# Patient Record
Sex: Female | Born: 1990 | Race: Black or African American | Hispanic: No | Marital: Single | Smoking: Former smoker
Health system: Southern US, Community
[De-identification: ages and names within clinical notes are randomized; demographics above are authoritative.]

## PROBLEM LIST (undated history)

## (undated) DIAGNOSIS — Z87891 Personal history of nicotine dependence: Secondary | ICD-10-CM

## (undated) DIAGNOSIS — F319 Bipolar disorder, unspecified: Secondary | ICD-10-CM

## (undated) DIAGNOSIS — F99 Mental disorder, not otherwise specified: Secondary | ICD-10-CM

## (undated) HISTORY — PX: NO PAST SURGERIES: SHX2092

---
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
## 2012-08-06 ENCOUNTER — Encounter: Payer: Self-pay | Admitting: General Practice

## 2012-08-06 ENCOUNTER — Ambulatory Visit (INDEPENDENT_AMBULATORY_CARE_PROVIDER_SITE_OTHER): Payer: BC Managed Care – PPO | Admitting: General Practice

## 2012-08-06 VITALS — BP 116/78 | HR 78 | Temp 98.5°F | Ht 64.0 in | Wt 238.0 lb

## 2012-08-06 DIAGNOSIS — H6690 Otitis media, unspecified, unspecified ear: Secondary | ICD-10-CM

## 2012-08-06 DIAGNOSIS — H6692 Otitis media, unspecified, left ear: Secondary | ICD-10-CM

## 2012-08-06 MED ORDER — AMOXICILLIN 500 MG PO CAPS: 500.0000 mg | ORAL_CAPSULE | Freq: Two times a day (BID) | ORAL | Status: DC

## 2012-08-06 MED ORDER — CIPROFLOXACIN-DEXAMETHASONE 0.3-0.1 % OT SUSP: 4.0000 [drp] | Freq: Two times a day (BID) | OTIC | Status: DC

## 2012-08-06 NOTE — Patient Instructions (Addendum)

## 2012-08-06 NOTE — Progress Notes (Signed)
  Subjective:    Patient ID: Anne Marks, female    DOB: 02-Nov-1990, 22 y.o.   MRN: [id number]  Otalgia  There is pain in the left ear. This is a recurrent problem. The current episode started more than 1 month ago. The problem has been gradually worsening. There has been no fever. The pain is at a severity of 9/10. Associated symptoms include a sore throat. Pertinent negatives include no abdominal pain, coughing, diarrhea, drainage, headaches, hearing loss, neck pain, rhinorrhea or vomiting. Treatments tried: decongestant. There is no history of a chronic ear infection.  Reports last menstrual cycle ended three days ago and it was a regular cycle.     Review of Systems  Constitutional: Negative for fever and chills.  HENT: Positive for ear pain and sore throat. Negative for hearing loss, rhinorrhea, sneezing, neck pain, neck stiffness, postnasal drip and sinus pressure.   Respiratory: Negative for cough and chest tightness.   Cardiovascular: Negative for chest pain and palpitations.  Gastrointestinal: Negative for vomiting, abdominal pain and diarrhea.  Genitourinary: Negative for dysuria and difficulty urinating.  Musculoskeletal: Negative for myalgias.  Neurological: Negative for dizziness and headaches.       Objective:   Physical Exam  Constitutional: She is oriented to person, place, and time. She appears well-developed and well-nourished.  HENT:  Head: Normocephalic and atraumatic.  Right Ear: External ear normal.  Left Ear: There is tenderness. No drainage. Tympanic membrane is erythematous.  Mouth/Throat: Oropharynx is clear and moist.  Cardiovascular: Normal rate, regular rhythm and normal heart sounds.   Pulmonary/Chest: Effort normal and breath sounds normal. No respiratory distress. She exhibits no tenderness.  Neurological: She is alert and oriented to person, place, and time.  Skin: Skin is warm and dry.  Psychiatric: She has a normal mood and affect.           Assessment & Plan:  1. Otitis media, left - ciprofloxacin-dexamethasone (CIPRODEX) otic suspension; Place 4 drops into the left ear 2 (two) times daily.  Dispense: 7.5 mL; Refill: 0 - amoxicillin (AMOXIL) 500 MG capsule; Take 1 capsule (500 mg total) by mouth 2 (two) times daily.  Dispense: 30 capsule; Refill: 0 -keep area clean and dry -refrain from placing objects in ear to clean -RTO if symptoms worsen or unresolved -Patient verbalized understanding Coralie Keens, FNP-C

## 2012-11-30 ENCOUNTER — Telehealth: Payer: Self-pay | Admitting: Nurse Practitioner

## 2012-11-30 NOTE — Telephone Encounter (Signed)
beyaz is not generic- loestrin fe is very simiar an dit is generic- woukd like that?

## 2012-12-04 MED ORDER — NORETHIN-ETH ESTRAD-FE BIPHAS 1 MG-10 MCG / 10 MCG PO TABS: 1.0000 | ORAL_TABLET | Freq: Every day | ORAL | Status: DC

## 2012-12-04 NOTE — Telephone Encounter (Signed)
rx changed and sent  To pharmacy

## 2012-12-04 NOTE — Telephone Encounter (Signed)
yes

## 2013-07-15 ENCOUNTER — Encounter: Payer: Self-pay | Admitting: General Practice

## 2013-07-15 ENCOUNTER — Ambulatory Visit (INDEPENDENT_AMBULATORY_CARE_PROVIDER_SITE_OTHER): Payer: BC Managed Care – PPO | Admitting: General Practice

## 2013-07-15 VITALS — BP 124/70 | HR 93 | Temp 98.7°F | Ht 64.0 in | Wt 257.2 lb

## 2013-07-15 DIAGNOSIS — J029 Acute pharyngitis, unspecified: Secondary | ICD-10-CM

## 2013-07-15 LAB — POCT RAPID STREP A (OFFICE): Rapid Strep A Screen: NEGATIVE

## 2013-07-15 MED ORDER — AMOXICILLIN 875 MG PO TABS: 875.0000 mg | ORAL_TABLET | Freq: Two times a day (BID) | ORAL | Status: DC

## 2013-07-15 NOTE — Patient Instructions (Signed)
Sore Throat A sore throat is pain, burning, irritation, or scratchiness of the throat. There is often pain or tenderness when swallowing or talking. A sore throat may be accompanied by other symptoms, such as coughing, sneezing, fever, and swollen neck glands. A sore throat is often the first sign of another sickness, such as a cold, flu, strep throat, or mononucleosis (commonly known as mono). Most sore throats go away without medical treatment. CAUSES  The most common causes of a sore throat include:  A viral infection, such as a cold, flu, or mono.  A bacterial infection, such as strep throat, tonsillitis, or whooping cough.  Seasonal allergies.  Dryness in the air.  Irritants, such as smoke or pollution.  Gastroesophageal reflux disease (GERD). HOME CARE INSTRUCTIONS   Only take over-the-counter medicines as directed by your caregiver.  Drink enough fluids to keep your urine clear or pale yellow.  Rest as needed.  Try using throat sprays, lozenges, or sucking on hard candy to ease any pain (if older than 4 years or as directed).  Sip warm liquids, such as broth, herbal tea, or warm water with honey to relieve pain temporarily. You may also eat or drink cold or frozen liquids such as frozen ice pops.  Gargle with salt water (mix 1 tsp salt with 8 oz of water).  Do not smoke and avoid secondhand smoke.  Put a cool-mist humidifier in your bedroom at night to moisten the air. You can also turn on a hot shower and sit in the bathroom with the door closed for 5 10 minutes. SEEK IMMEDIATE MEDICAL CARE IF:  You have difficulty breathing.  You are unable to swallow fluids, soft foods, or your saliva.  You have increased swelling in the throat.  Your sore throat does not get better in 7 days.  You have nausea and vomiting.  You have a fever or persistent symptoms for more than 2 3 days.  You have a fever and your symptoms suddenly get worse. MAKE SURE YOU:   Understand  these instructions.  Will watch your condition.  Will get help right away if you are not doing well or get worse. Document Released: 03/03/2004 Document Revised: 01/11/2012 Document Reviewed: 10/02/2011 ExitCare Patient Information 2014 [employer].  

## 2013-07-15 NOTE — Progress Notes (Signed)
   Subjective:    Patient ID: Angelia Mould, female    DOB: March 31, 1990, 23 y.o.   MRN: [id number]  Otalgia  There is pain in the right ear. This is a new problem. The current episode started in the past 7 days. The problem occurs every few hours. The problem has been unchanged. There has been no fever. The pain is at a severity of 3/10. Associated symptoms include a sore throat. Pertinent negatives include no coughing, diarrhea, ear discharge, headaches, hearing loss, rash, rhinorrhea or vomiting. She has tried acetaminophen for the symptoms. The treatment provided mild relief.  Sore Throat  This is a new problem. The current episode started in the past 7 days. The problem has been gradually worsening. Neither side of throat is experiencing more pain than the other. There has been no fever. The pain is at a severity of 7/10. Associated symptoms include ear pain. Pertinent negatives include no coughing, diarrhea, drooling, ear discharge, headaches, hoarse voice, shortness of breath or vomiting. She has had no exposure to strep or mono. She has tried cool liquids for the symptoms. The treatment provided mild relief.  Reports last menstrual cycle ended 2 weeks ago and cycle was regular.   Review of Systems  Constitutional: Negative for chills and fatigue.  HENT: Positive for ear pain and sore throat. Negative for drooling, ear discharge, hearing loss, hoarse voice, rhinorrhea, sinus pressure and sneezing.   Respiratory: Negative for cough, chest tightness and shortness of breath.   Cardiovascular: Negative for chest pain and palpitations.  Gastrointestinal: Negative for vomiting and diarrhea.  Skin: Negative for rash.  Neurological: Negative for dizziness, weakness and headaches.       Objective:   Physical Exam  Constitutional: She is oriented to person, place, and time. She appears well-developed and well-nourished.  HENT:  Head: Normocephalic and atraumatic.  Right Ear: External ear normal.    Left Ear: External ear normal.  Nose: Nose normal.  Mouth/Throat: Posterior oropharyngeal erythema present.  2+ bilateral tonsils  Neck: Normal range of motion. Neck supple. No thyromegaly present.  Cardiovascular: Normal rate, regular rhythm and normal heart sounds.   Pulmonary/Chest: Effort normal and breath sounds normal.  Lymphadenopathy:    She has no cervical adenopathy.  Neurological: She is alert and oriented to person, place, and time.  Skin: Skin is warm and dry. No rash noted.  Psychiatric: She has a normal mood and affect.    Results for orders placed in visit on 07/15/13  POCT RAPID STREP A (OFFICE)      Result Value Ref Range   Rapid Strep A Screen Negative  Negative         Assessment & Plan:  1. Sore throat - POCT rapid strep A - Strep A culture, throat - amoxicillin (AMOXIL) 875 MG tablet; Take 1 tablet (875 mg total) by mouth 2 (two) times daily.  Dispense: 20 tablet; Refill: 0 -discussed and provided educational material -RTO if symptoms worsen or unresolved Patient verbalized understanding Erby Pian, FNP-C

## 2013-07-17 LAB — STREP A CULTURE, THROAT

## 2013-08-28 ENCOUNTER — Telehealth: Payer: Self-pay | Admitting: Nurse Practitioner

## 2013-08-28 NOTE — Telephone Encounter (Signed)
Patient aware that Ronnald Collum does not put them in unless you have had a baby

## 2014-02-07 NOTE — L&D Delivery Note (Signed)
  Vaginal Delivery Note The pt utilized an epidural as pain management.   Spontaneous  rupture of membranes today, at 0130 clear.  GBS was negative.   Cervical dilation was complete at  0340.  NICHD Category 2.    Called to RM to apply fetal scalp and evaluate patient.  Lip/100/+2 . Pt rapidly progressed to complete and began to push with guidance at 0348.   After 14 minutes of pushing the head, shoulders and the body of a viable female  infant delivered spontaneously with maternal effort in the ROA position at 0402   With vigorous tone and spontaneous cry, the infant was placed on moms abd.  After the umbilical cord was clamped it was cut by the FOB, then cord blood was obtained for evaluation.  Spontaneous delivery of a intact placenta with a 3 vessel cord via [place] at  [place].   Skyla IUD with strings intact expelled with Placenta.    Episiotomy: None   The vulva, perineum, vaginal vault, rectum and cervix were inspected and revealed a First degree perineal, repaired using a 3-0 vicryl on a CT needle and a   perirethral laceration which was repaired using a 3-0 vicryl on a SH  needle with 10cc of 1% lidocaine. The rectum sphincter intact after the repair.   Patient tolerated repair well.   Postpartum pitocin as ordered.    EBL 151, Pt hemodynamically stable.   Sponge, laps and needle count correct and verified with the primary care nurse.  Attending MD available at all times.    Routine postpartum orders   Mother unsure about method of contraception, considering Mirena.    Baby up for adoption. Undergoing open adoption with couple from [place]    Placenta to pathology: NO     Cord Gases sent to lab: NO Cord blood sent to lab: YES   APGARS:  8 at 1 minute and 9 at 5 minutes Weight:.    Both mom and baby were left in stable condition, baby skin to skin.     Anne Marks, CNM, MSN 01/28/2015. 4:55 AM

## 2014-07-03 ENCOUNTER — Encounter: Payer: Self-pay | Admitting: Physician Assistant

## 2014-07-03 ENCOUNTER — Ambulatory Visit (INDEPENDENT_AMBULATORY_CARE_PROVIDER_SITE_OTHER): Payer: 59 | Admitting: Physician Assistant

## 2014-07-03 VITALS — BP 125/78 | HR 103 | Temp 98.0°F | Ht 64.0 in | Wt 261.2 lb

## 2014-07-03 DIAGNOSIS — L309 Dermatitis, unspecified: Secondary | ICD-10-CM | POA: Diagnosis not present

## 2014-07-03 DIAGNOSIS — B354 Tinea corporis: Secondary | ICD-10-CM | POA: Diagnosis not present

## 2014-07-03 MED ORDER — KETOCONAZOLE 2 % EX SHAM: MEDICATED_SHAMPOO | CUTANEOUS | Status: DC

## 2014-07-03 MED ORDER — HYDROCORTISONE 2.5 % EX CREA: TOPICAL_CREAM | Freq: Two times a day (BID) | CUTANEOUS | Status: DC

## 2014-07-03 MED ORDER — FLUCONAZOLE 150 MG PO TABS: ORAL_TABLET | ORAL | Status: DC

## 2014-07-03 NOTE — Progress Notes (Signed)
   Subjective:    Patient ID: Anne Marks, female    DOB: 05-23-1990, 24 y.o.   MRN: [id number]  HPI 24 y/o female presents with c/o rash on neck x 2 days. Initially was not itching but is now. Has spread to both sides of neck. Has tried otc hydrocortisone with no relief. Washed with peppermint soap. Similar episode 1 year ago.     Review of Systems  Skin: Positive for rash (both sides of neck , pruritic, worsening ).       Objective:   Physical Exam  Constitutional: She appears well-developed and well-nourished.  Skin: Rash noted. No erythema.  Patch plaque lesion on neck with whitish scale Various sizes with marked borders Papules on anterior chest   Nursing note and vitals reviewed.         Assessment & Plan:  1. Dermatitis - shave biopsy of <0.5cm lesion on posterior neck to confirm dx  - Pathology to r/o tinea vs psoriasis - fluconazole (DIFLUCAN) 150 MG tablet; 1 pill q 3 days until finished  Dispense: 4 tablet; Refill: 0 - hydrocortisone 2.5 % cream; Apply topically 2 (two) times daily.  Dispense: 30 g; Refill: 0 - Aerobic culture to r/o bacterial infection   2. Tinea corporis   - ketoconazole (NIZORAL) 2 % shampoo; Wash AA with wash. Leave on 5 min. Rinse x 14 days , then 2-3 times weekly  Dispense: 120 mL; Refill: 2   May use Atarax TID for itch or add zyrtec 10mg  daily  RTO 2 weeks   Cotina Freedman A. Carla Rashad PA-C  \

## 2014-07-03 NOTE — Patient Instructions (Signed)
Continue to use Dove soap Can take Atarax 3 x daily for itch - may add zyrtec 10mg  if needed

## 2014-07-05 LAB — AEROBIC CULTURE

## 2014-07-10 LAB — PATHOLOGY

## 2014-07-18 ENCOUNTER — Ambulatory Visit: Payer: 59 | Admitting: Physician Assistant

## 2014-07-24 ENCOUNTER — Ambulatory Visit (INDEPENDENT_AMBULATORY_CARE_PROVIDER_SITE_OTHER): Payer: 59 | Admitting: Physician Assistant

## 2014-07-24 ENCOUNTER — Encounter: Payer: Self-pay | Admitting: Physician Assistant

## 2014-07-24 VITALS — BP 114/80 | HR 87 | Temp 98.1°F | Ht 64.0 in | Wt 262.0 lb

## 2014-07-24 DIAGNOSIS — L408 Other psoriasis: Secondary | ICD-10-CM | POA: Diagnosis not present

## 2014-07-24 MED ORDER — DERMA-SMOOTHE/FS BODY 0.01 % EX OIL: TOPICAL_OIL | CUTANEOUS | Status: DC

## 2014-07-24 NOTE — Progress Notes (Signed)
   Subjective:    Patient ID: Anne Marks, female    DOB: 06-07-1990, 24 y.o.   MRN: [id number]  HPI 24 y/o female presents for follow up of worsening and pruritic rash on neck. She has completed course of diflucan x 4 doses, continues to use the ketoconazole shampoo and used hydrocortisone briefly. She feels that its much improved, not itching as much. [place]. She used the ketoconazole shampooo which cleared it in two days.     Review of Systems  Skin:       Mild itch and scale remains on neck Dandruff of scalp        Objective:   Physical Exam  Skin:  Plaques on posterior neck, approximately 3cm in diameter x 2 with whitish scale  Significant white scale and flaking on scalp           Assessment & Plan:  1. Seborrheic psoriasis - Alternate t-sal shampoo with ketoconazole shampoo as directed  - Fluocinolone Acetonide (DERMA-SMOOTHE/FS BODY) 0.01 % OIL; Use 2-3 drops on AA daily for itch and scale  Dispense: 1 Bottle; Refill: 3  RTC prn   Krysteena Stalker A. Benjamin Stain PA-C

## 2014-07-24 NOTE — Patient Instructions (Signed)
Use t-sal shampoo once weekly - leave in as directed. Alternate with Ketoconazole shampoo once weekly  Apply oil daily to neck and scalp - 2-3 drops to scalps , 1 drop on neck  Continue to use dove soap     Seborrheic Dermatitis Seborrheic dermatitis involves pink or red skin with greasy, flaky scales. This is often found on the scalp, eyebrows, nose, bearded area, and on or behind the ears. It can also occur on the central chest. It often occurs where there are more oil (sebaceous) glands. This condition is also known as dandruff. When this condition affects a baby's scalp, it is called cradle cap. It may come and go for no known reason. It can occur at any time of life from infancy to old age. CAUSES  The cause is unknown. It is not the result of too little moisture or too much oil. In some people, seborrheic dermatitis flare-ups seem to be triggered by stress. It also commonly occurs in people with certain diseases such as Parkinson's disease or HIV/AIDS. SYMPTOMS   Thick scales on the scalp.  Redness on the face or in the armpits.  The skin may seem oily or dry, but moisturizers do not help.  In infants, seborrheic dermatitis appears as scaly redness that does not seem to bother the baby. In some babies, it affects only the scalp. In others, it also affects the neck creases, armpits, groin, or behind the ears.  In adults and adolescents, seborrheic dermatitis may affect only the scalp. It may look patchy or spread out, with areas of redness and flaking. Other areas commonly affected include:  Eyebrows.  Eyelids.  Forehead.  Skin behind the ears.  Outer ears.  Chest.  Armpits.  Nose creases.  Skin creases under the breasts.  Skin between the buttocks.  Groin.  Some adults and adolescents feel itching or burning in the affected areas. DIAGNOSIS  Your caregiver can usually tell what the problem is by doing a physical exam. TREATMENT   Cortisone (steroid) ointments,  creams, and lotions can help decrease inflammation.  Babies can be treated with baby oil to soften the scales, then they may be washed with baby shampoo. If this does not help, a prescription topical steroid medicine may work.  Adults can use medicated shampoos.  Your caregiver may prescribe corticosteroid cream and shampoo containing an antifungal or yeast medicine (ketoconazole). Hydrocortisone or anti-yeast cream can be rubbed directly onto seborrheic dermatitis patches. Yeast does not cause seborrheic dermatitis, but it seems to add to the problem. In infants, seborrheic dermatitis is often worst during the first year of life. It tends to disappear on its own as the child grows. However, it may return during the teenage years. In adults and adolescents, seborrheic dermatitis tends to be a long-lasting condition that comes and goes over many years. HOME CARE INSTRUCTIONS   Use prescribed medicines as directed.  In infants, do not aggressively remove the scales or flakes on the scalp with a comb or by other means. This may lead to hair loss. SEEK MEDICAL CARE IF:   The problem does not improve from the medicated shampoos, lotions, or other medicines given by your caregiver.  You have any other questions or concerns. Document Released: 01/24/2005 Document Revised: 07/26/2011 Document Reviewed: 06/15/2009 [facility] Patient Information 2015 [place], [place]. This information is not intended to replace advice given to you by your health care provider. Make sure you discuss any questions you have with your health care provider.

## 2014-08-25 ENCOUNTER — Encounter: Payer: Self-pay | Admitting: Family Medicine

## 2014-08-25 ENCOUNTER — Ambulatory Visit (INDEPENDENT_AMBULATORY_CARE_PROVIDER_SITE_OTHER): Payer: 59 | Admitting: Family Medicine

## 2014-08-25 VITALS — Temp 98.5°F | Ht 64.0 in | Wt 258.0 lb

## 2014-08-25 DIAGNOSIS — J029 Acute pharyngitis, unspecified: Secondary | ICD-10-CM | POA: Diagnosis not present

## 2014-08-25 DIAGNOSIS — J039 Acute tonsillitis, unspecified: Secondary | ICD-10-CM

## 2014-08-25 LAB — POCT RAPID STREP A (OFFICE): RAPID STREP A SCREEN: NEGATIVE

## 2014-08-25 MED ORDER — AMOXICILLIN-POT CLAVULANATE 875-125 MG PO TABS: 1.0000 | ORAL_TABLET | Freq: Two times a day (BID) | ORAL | Status: DC

## 2014-08-25 MED ORDER — FLUCONAZOLE 150 MG PO TABS: 150.0000 mg | ORAL_TABLET | Freq: Once | ORAL | Status: DC

## 2014-08-25 NOTE — Progress Notes (Signed)
Subjective:  Patient ID: Anne Marks, female    DOB: 27-Sep-1990  Age: 24 y.o. MRN: [id number]  CC: Sore Throat   HPI Anne Marks presents for   severe sore throat for 2-3 days. Sharp pain in the posterior oropharynx. Decreases her ability to swallow. She has been eating less because of this. Low-grade fever noted.  History Anne Marks has no past medical history on file.   She has no past surgical history on file.   Her family history is not on file.She reports that she quit smoking about 2 years ago. She does not have any smokeless tobacco history on file. She reports that she does not drink alcohol or use illicit drugs.  Outpatient Prescriptions Prior to Visit  Medication Sig Dispense Refill  . hydrOXYzine (ATARAX/VISTARIL) 10 MG tablet   2  . lamoTRIgine (LAMICTAL) 100 MG tablet Take 100 mg by mouth daily.  2  . Fluocinolone Acetonide (DERMA-SMOOTHE/FS BODY) 0.01 % OIL Use 2-3 drops on AA daily for itch and scale 1 Bottle 3  . hydrocortisone 2.5 % cream Apply topically 2 (two) times daily. 30 g 0  . ketoconazole (NIZORAL) 2 % shampoo Wash AA with wash. Leave on 5 min. Rinse x 14 days , then 2-3 times weekly 120 mL 2   No facility-administered medications prior to visit.    ROS Review of Systems  Constitutional: Negative for fever, chills, activity change and appetite change.  HENT: Positive for congestion, postnasal drip and sore throat. Negative for ear discharge, ear pain, hearing loss, nosebleeds, rhinorrhea, sneezing and trouble swallowing.   Respiratory: Negative for chest tightness and shortness of breath.   Cardiovascular: Negative for chest pain and palpitations.  Skin: Negative for rash.    Objective:  Temp(Src) 98.5 F (36.9 C) (Oral)  Ht 5\' 4"  (1.626 m)  Wt 258 lb (117.028 kg)  BMI 44.26 kg/m2  BP Readings from Last 3 Encounters:  07/24/14 114/80  07/03/14 125/78  07/15/13 124/70    Wt Readings from Last 3 Encounters:  08/25/14 258 lb (117.028 kg)    07/24/14 262 lb (118.842 kg)  07/03/14 261 lb 3.2 oz (118.48 kg)     Physical Exam  Constitutional: She appears well-developed and well-nourished.  HENT:  Head: Normocephalic and atraumatic.  Right Ear: Tympanic membrane and external ear normal. No decreased hearing is noted.  Left Ear: Tympanic membrane and external ear normal. No decreased hearing is noted.  Nose: Mucosal edema present. Right sinus exhibits no frontal sinus tenderness. Left sinus exhibits no frontal sinus tenderness.  Mouth/Throat: No uvula swelling. Oropharyngeal exudate and posterior oropharyngeal edema present. No posterior oropharyngeal erythema or tonsillar abscesses (however the tonsils are enlarged and erythematous).  Neck: Normal range of motion. No Brudzinski's sign noted.  Cardiovascular: Normal rate and regular rhythm.   No murmur heard. Pulmonary/Chest: Breath sounds normal. No respiratory distress.  Lymphadenopathy:       Head (right side): No preauricular adenopathy present.       Head (left side): No preauricular adenopathy present.    She has no cervical adenopathy.       Right cervical: No superficial cervical adenopathy present.      Left cervical: No superficial cervical adenopathy present.    No results found for: HGBA1C  No results found for: WBC, HGB, HCT, PLT, GLUCOSE, CHOL, TRIG, HDL, LDLDIRECT, LDLCALC, ALT, AST, NA, K, CL, CREATININE, BUN, CO2, TSH, PSA, INR, GLUF, HGBA1C, MICROALBUR  No results found.  Assessment & Plan:  Anne Marks was seen today for sore throat.  Diagnoses and all orders for this visit:  Sore throat Orders: -     POCT rapid strep A -     Culture, respiratory -     Ambulatory referral to ENT  Acute tonsillitis Orders: -     Culture, respiratory -     Ambulatory referral to ENT  Other orders -     fluconazole (DIFLUCAN) 150 MG tablet; Take 1 tablet (150 mg total) by mouth once. -     amoxicillin-clavulanate (AUGMENTIN) 875-125 MG per tablet; Take 1 tablet by  mouth 2 (two) times daily. Take all of this medication   I have discontinued Ms. Blanchard ketoconazole, hydrocortisone, and DERMA-SMOOTHE/FS BODY. I am also having her start on fluconazole and amoxicillin-clavulanate. Additionally, I am having her maintain her hydrOXYzine and lamoTRIgine.  Meds ordered this encounter  Medications  . fluconazole (DIFLUCAN) 150 MG tablet    Sig: Take 1 tablet (150 mg total) by mouth once.    Dispense:  1 tablet    Refill:  0  . amoxicillin-clavulanate (AUGMENTIN) 875-125 MG per tablet    Sig: Take 1 tablet by mouth 2 (two) times daily. Take all of this medication    Dispense:  20 tablet    Refill:  0     Follow-up: Return if symptoms worsen or fail to improve.  Claretta Fraise, M.D.

## 2014-08-29 LAB — CULTURE, RESPIRATORY W GRAM STAIN

## 2014-08-29 LAB — CULTURE, RESPIRATORY

## 2014-09-01 ENCOUNTER — Telehealth: Payer: Self-pay | Admitting: Family Medicine

## 2014-11-21 ENCOUNTER — Inpatient Hospital Stay (HOSPITAL_COMMUNITY): Payer: 59

## 2014-11-21 ENCOUNTER — Inpatient Hospital Stay (HOSPITAL_COMMUNITY)
Admission: AD | Admit: 2014-11-21 | Discharge: 2014-11-21 | Disposition: A | Discharge status: Home / Self Care | Payer: 59 | Source: Ambulatory Visit | Attending: Obstetrics and Gynecology | Admitting: Obstetrics and Gynecology

## 2014-11-21 ENCOUNTER — Encounter (HOSPITAL_COMMUNITY): Payer: Self-pay

## 2014-11-21 DIAGNOSIS — Z87891 Personal history of nicotine dependence: Secondary | ICD-10-CM | POA: Insufficient documentation

## 2014-11-21 DIAGNOSIS — F319 Bipolar disorder, unspecified: Secondary | ICD-10-CM | POA: Diagnosis not present

## 2014-11-21 DIAGNOSIS — R109 Unspecified abdominal pain: Secondary | ICD-10-CM | POA: Diagnosis present

## 2014-11-21 DIAGNOSIS — O23593 Infection of other part of genital tract in pregnancy, third trimester: Secondary | ICD-10-CM | POA: Diagnosis not present

## 2014-11-21 DIAGNOSIS — Z3A28 28 weeks gestation of pregnancy: Secondary | ICD-10-CM | POA: Insufficient documentation

## 2014-11-21 DIAGNOSIS — B9689 Other specified bacterial agents as the cause of diseases classified elsewhere: Secondary | ICD-10-CM | POA: Diagnosis not present

## 2014-11-21 DIAGNOSIS — O0933 Supervision of pregnancy with insufficient antenatal care, third trimester: Secondary | ICD-10-CM | POA: Diagnosis not present

## 2014-11-21 DIAGNOSIS — Z6841 Body Mass Index (BMI) 40.0 and over, adult: Secondary | ICD-10-CM

## 2014-11-21 DIAGNOSIS — Z975 Presence of (intrauterine) contraceptive device: Secondary | ICD-10-CM | POA: Insufficient documentation

## 2014-11-21 DIAGNOSIS — N760 Acute vaginitis: Secondary | ICD-10-CM | POA: Insufficient documentation

## 2014-11-21 DIAGNOSIS — O99343 Other mental disorders complicating pregnancy, third trimester: Secondary | ICD-10-CM | POA: Insufficient documentation

## 2014-11-21 DIAGNOSIS — O0930 Supervision of pregnancy with insufficient antenatal care, unspecified trimester: Secondary | ICD-10-CM

## 2014-11-21 DIAGNOSIS — O2632 Retained intrauterine contraceptive device in pregnancy, second trimester: Secondary | ICD-10-CM

## 2014-11-21 HISTORY — DX: Bipolar disorder, unspecified: F31.9

## 2014-11-21 HISTORY — DX: Mental disorder, not otherwise specified: F99

## 2014-11-21 LAB — URINALYSIS, ROUTINE W REFLEX MICROSCOPIC
BILIRUBIN URINE: NEGATIVE
GLUCOSE, UA: NEGATIVE mg/dL
Hgb urine dipstick: NEGATIVE
Ketones, ur: >80 mg/dL — AB
Leukocytes, UA: NEGATIVE
Nitrite: POSITIVE — AB
PH: 6.5 (ref 5.0–8.0)
Protein, ur: NEGATIVE mg/dL
Specific Gravity, Urine: 1.02 (ref 1.005–1.030)
Urobilinogen, UA: 0.2 mg/dL (ref 0.0–1.0)

## 2014-11-21 LAB — WET PREP, GENITAL
TRICH WET PREP: NONE SEEN
Yeast Wet Prep HPF POC: NONE SEEN

## 2014-11-21 LAB — URINE MICROSCOPIC-ADD ON

## 2014-11-21 LAB — POCT PREGNANCY, URINE: Preg Test, Ur: POSITIVE — AB

## 2014-11-21 NOTE — MAU Note (Addendum)
hAd been having abd pain and gas pains.  [facility]  Was told to come here for further eval.  9/24 bled for a day.  IUD was placed in April

## 2014-11-21 NOTE — Discharge Instructions (Signed)
Pick up prescription at pharmacy.   Preterm Labor Information Preterm labor is when labor starts at less than 37 weeks of pregnancy. The normal length of a pregnancy is 39 to 41 weeks. CAUSES Often, there is no identifiable underlying cause as to why a woman goes into preterm labor. One of the most common known causes of preterm labor is infection. Infections of the uterus, cervix, vagina, amniotic sac, bladder, kidney, or even the lungs (pneumonia) can cause labor to start. Other suspected causes of preterm labor include:   Urogenital infections, such as yeast infections and bacterial vaginosis.   Uterine abnormalities (uterine shape, uterine septum, fibroids, or bleeding from the placenta).   A cervix that has been operated on (it may fail to stay closed).   Malformations in the fetus.   Multiple gestations (twins, triplets, and so on).   Breakage of the amniotic sac.  RISK FACTORS  Having a previous history of preterm labor.   Having premature rupture of membranes (PROM).   Having a placenta that covers the opening of the cervix (placenta previa).   Having a placenta that separates from the uterus (placental abruption).   Having a cervix that is too weak to hold the fetus in the uterus (incompetent cervix).   Having too much fluid in the amniotic sac (polyhydramnios).   Taking illegal drugs or smoking while pregnant.   Not gaining enough weight while pregnant.   Being younger than 92 and older than 22 years old.   Having a low socioeconomic status.   Being African American. SYMPTOMS Signs and symptoms of preterm labor include:   Menstrual-like cramps, abdominal pain, or back pain.  Uterine contractions that are regular, as frequent as six in an hour, regardless of their intensity (may be mild or painful).  Contractions that start on the top of the uterus and spread down to the lower abdomen and back.   A sense of increased pelvic pressure.   A  watery or bloody mucus discharge that comes from the vagina.  TREATMENT Depending on the length of the pregnancy and other circumstances, your health care provider may suggest bed rest. If necessary, there are medicines that can be given to stop contractions and to mature the fetal lungs. If labor happens before 34 weeks of pregnancy, a prolonged hospital stay may be recommended. Treatment depends on the condition of both you and the fetus.  WHAT SHOULD YOU DO IF YOU THINK YOU ARE IN PRETERM LABOR? Call your health care provider right away. You will need to go to the hospital to get checked immediately. HOW CAN YOU PREVENT PRETERM LABOR IN FUTURE PREGNANCIES? You should:   Stop smoking if you smoke.  Maintain healthy weight gain and avoid chemicals and drugs that are not necessary.  Be watchful for any type of infection.  Inform your health care provider if you have a known history of preterm labor.   This information is not intended to replace advice given to you by your health care provider. Make sure you discuss any questions you have with your health care provider.   Document Released: 04/16/2003 Document Revised: 09/26/2012 Document Reviewed: 02/27/2012 Elsevier Interactive Patient Education 2016 Elsevier Inc. Bacterial Vaginosis Bacterial vaginosis is a vaginal infection that occurs when the normal balance of bacteria in the vagina is disrupted. It results from an overgrowth of certain bacteria. This is the most common vaginal infection in women of childbearing age. Treatment is important to prevent complications, especially in pregnant women, as it  can cause a premature delivery. CAUSES  Bacterial vaginosis is caused by an increase in harmful bacteria that are normally present in smaller amounts in the vagina. Several different kinds of bacteria can cause bacterial vaginosis. However, the reason that the condition develops is not fully understood. RISK FACTORS Certain activities or  behaviors can put you at an increased risk of developing bacterial vaginosis, including:  Having a new sex partner or multiple sex partners.  Douching.  Using an intrauterine device (IUD) for contraception. Women do not get bacterial vaginosis from toilet seats, bedding, swimming pools, or contact with objects around them. SIGNS AND SYMPTOMS  Some women with bacterial vaginosis have no signs or symptoms. Common symptoms include:  Grey vaginal discharge.  A fishlike odor with discharge, especially after sexual intercourse.  Itching or burning of the vagina and vulva.  Burning or pain with urination. DIAGNOSIS  Your health care provider will take a medical history and examine the vagina for signs of bacterial vaginosis. A sample of vaginal fluid may be taken. Your health care provider will look at this sample under a microscope to check for bacteria and abnormal cells. A vaginal pH test may also be done.  TREATMENT  Bacterial vaginosis may be treated with antibiotic medicines. These may be given in the form of a pill or a vaginal cream. A second round of antibiotics may be prescribed if the condition comes back after treatment. Because bacterial vaginosis increases your risk for sexually transmitted diseases, getting treated can help reduce your risk for chlamydia, gonorrhea, HIV, and herpes. HOME CARE INSTRUCTIONS   Only take over-the-counter or prescription medicines as directed by your health care provider.  If antibiotic medicine was prescribed, take it as directed. Make sure you finish it even if you start to feel better.  Tell all sexual partners that you have a vaginal infection. They should see their health care provider and be treated if they have problems, such as a mild rash or itching.  During treatment, it is important that you follow these instructions:  Avoid sexual activity or use condoms correctly.  Do not douche.  Avoid alcohol as directed by your health care  provider.  Avoid breastfeeding as directed by your health care provider. SEEK MEDICAL CARE IF:   Your symptoms are not improving after 3 days of treatment.  You have increased discharge or pain.  You have a fever. MAKE SURE YOU:   Understand these instructions.  Will watch your condition.  Will get help right away if you are not doing well or get worse. FOR MORE INFORMATION  Centers for Disease Control and Prevention, Division of STD Prevention: [employer]raiser[employer]raud[employer] American Sexual Health Association (ASHA): www.ashastd.org    This information is not intended to replace advice given to you by your health care provider. Make sure you discuss any questions you have with your health care provider.   Document Released: 01/24/2005 Document Revised: 02/14/2014 Document Reviewed: 09/05/2012 Elsevier Interactive Patient Education [employer].

## 2014-11-21 NOTE — MAU Note (Signed)
Spoke with Dr. Cletis Media gave report on patient, will be seen by Donnel Saxon CNM after 7 pm

## 2014-11-21 NOTE — Progress Notes (Signed)
Asked patient what day her IUD was placed and she said it was place April 5th 2016

## 2014-11-22 DIAGNOSIS — Z6841 Body Mass Index (BMI) 40.0 and over, adult: Secondary | ICD-10-CM

## 2014-11-22 DIAGNOSIS — F319 Bipolar disorder, unspecified: Secondary | ICD-10-CM | POA: Diagnosis present

## 2014-11-22 DIAGNOSIS — O0930 Supervision of pregnancy with insufficient antenatal care, unspecified trimester: Secondary | ICD-10-CM

## 2014-11-22 NOTE — MAU Provider Note (Signed)
History   24 yo patient presented unannounced c/o abdominal and gas pains, with IUD in place since 05/2014, but +UPT at [facility].  Denies bleeding, leaking, HA, visual sx, or epigastric pain.  [place] in MAU showed SIUP, vtx, 28 6/7 weeks, normal fluid.  Patient very surprised by findings, friend present with her tonight.  Patient advises she does not plan to tell her family of the pregnancy.  Has had generally healthy lifestyle, but has still has some alcohol intake during the last several months.  Patient Active Problem List   Diagnosis Date Noted  . Bipolar 1 disorder ([place]) 11/22/2014  . Late prenatal care--new dx pregnancy at 74 weeks 11/22/2014  . BMI 45.0-49.9, adult [facility]) 11/22/2014    Chief Complaint  Patient presents with  . Abdominal Pain  . Possible Pregnancy   HPI:  See above  OB History    Gravida Para Term Preterm AB TAB SAB Ectopic Multiple Living   1               Past Medical History  Diagnosis Date  . Mental disorder   . Bipolar 1 disorder ([place])     History reviewed. No pertinent past surgical history.  History reviewed. No pertinent family history.  Social History  Substance Use Topics  . Smoking status: Former Smoker    Quit date: 10/09/2011  . Smokeless tobacco: None  . Alcohol Use: Yes     Comment: a weekedn a month   Patient is bisexual  Allergies: No Known Allergies  No prescriptions prior to admission    ROS: Mild lower abdominal pain Physical Exam   Blood pressure 136/72, pulse 80, temperature 98.3 F (36.8 C), temperature source Oral, resp. rate 16, height 5' 3.5" (1.613 m), weight 112.946 kg (249 lb).    Physical Exam  In NAD Chest clear Heart RRR without murmur Abd gravid, FH approx 28-29 cm Pelvic--small amount white d/c in vault, cervix closed, firm.  No IUD string visible at os. Ext WNL  FHR Category 1 on intermittent tracing due to body habitus. No UCs  Results for orders placed or performed during the hospital  encounter of 11/21/14 (from the past 24 hour(s))  Urinalysis, Routine w reflex microscopic (not at [facility])     Status: Abnormal   Collection Time: 11/21/14  4:55 PM  Result Value Ref Range   Color, Urine YELLOW YELLOW   APPearance CLEAR CLEAR   Specific Gravity, Urine 1.020 1.005 - 1.030   pH 6.5 5.0 - 8.0   Glucose, UA NEGATIVE NEGATIVE mg/dL   Hgb urine dipstick NEGATIVE NEGATIVE   Bilirubin Urine NEGATIVE NEGATIVE   Ketones, ur >80 (A) NEGATIVE mg/dL   Protein, ur NEGATIVE NEGATIVE mg/dL   Urobilinogen, UA 0.2 0.0 - 1.0 mg/dL   Nitrite POSITIVE (A) NEGATIVE   Leukocytes, UA NEGATIVE NEGATIVE  Urine microscopic-add on     Status: Abnormal   Collection Time: 11/21/14  4:55 PM  Result Value Ref Range   Squamous Epithelial / LPF FEW (A) RARE   WBC, UA 0-2 <3 WBC/hpf   Bacteria, UA FEW (A) RARE   Urine-Other MUCOUS PRESENT   Pregnancy, urine POC     Status: Abnormal   Collection Time: 11/21/14  5:12 PM  Result Value Ref Range   Preg Test, Ur POSITIVE (A) NEGATIVE  Wet prep, genital     Status: Abnormal   Collection Time: 11/21/14  8:20 PM  Result Value Ref Range   Yeast Wet Prep  HPF POC NONE SEEN NONE SEEN   Trich, Wet Prep NONE SEEN NONE SEEN   Clue Cells Wet Prep HPF POC FEW (A) NONE SEEN   WBC, Wet Prep HPF POC FEW (A) NONE SEEN    ED Course  Assessment: IUP at 28 6/7 weeks New dx of pregnancy with IUD in place Bipolar dx BMI 54 BV  Plan: D/C home per consult with Dr. Mancel Bale Rx Metronidazole 500 mg po BID x 7 days Continue Lamictal and Hydroxyzine. Support to patient for new dx of pregnancy--discussed issue of IUD either has been expelled or is higher up in the uterus.  Will notify office to contact patient on Monday and get her set up ASAP for NOB interview/w-u and [place] for anatomy. GC, chlamydia pending.   Donnel Saxon CNM, MSN 11/22/2014 3:05 AM

## 2014-11-24 LAB — GC/CHLAMYDIA PROBE AMP (~~LOC~~) NOT AT ARMC
Chlamydia: NEGATIVE
Neisseria Gonorrhea: NEGATIVE

## 2014-11-27 LAB — OB RESULTS CONSOLE ABO/RH: RH TYPE: POSITIVE

## 2014-11-27 LAB — OB RESULTS CONSOLE ANTIBODY SCREEN: Antibody Screen: NEGATIVE

## 2014-11-27 LAB — OB RESULTS CONSOLE RUBELLA ANTIBODY, IGM: Rubella: IMMUNE

## 2014-11-27 LAB — OB RESULTS CONSOLE RPR: RPR: NONREACTIVE

## 2014-11-27 LAB — OB RESULTS CONSOLE GC/CHLAMYDIA
Chlamydia: NEGATIVE
Gonorrhea: NEGATIVE

## 2014-11-27 LAB — OB RESULTS CONSOLE HIV ANTIBODY (ROUTINE TESTING): HIV: NONREACTIVE

## 2014-11-27 LAB — OB RESULTS CONSOLE HEPATITIS B SURFACE ANTIGEN: Hepatitis B Surface Ag: NEGATIVE

## 2015-01-02 ENCOUNTER — Encounter (HOSPITAL_COMMUNITY): Payer: Self-pay

## 2015-01-02 ENCOUNTER — Ambulatory Visit (HOSPITAL_COMMUNITY)
Admission: RE | Admit: 2015-01-02 | Discharge: 2015-01-02 | Disposition: A | Discharge status: Home / Self Care | Payer: 59 | Source: Ambulatory Visit | Attending: Obstetrics & Gynecology | Admitting: Obstetrics & Gynecology

## 2015-01-02 ENCOUNTER — Other Ambulatory Visit (HOSPITAL_COMMUNITY): Payer: Self-pay | Admitting: Obstetrics & Gynecology

## 2015-01-02 DIAGNOSIS — Z36 Encounter for antenatal screening of mother: Secondary | ICD-10-CM | POA: Insufficient documentation

## 2015-01-02 DIAGNOSIS — Z3689 Encounter for other specified antenatal screening: Secondary | ICD-10-CM

## 2015-01-02 DIAGNOSIS — Z975 Presence of (intrauterine) contraceptive device: Secondary | ICD-10-CM | POA: Diagnosis not present

## 2015-01-02 DIAGNOSIS — O99213 Obesity complicating pregnancy, third trimester: Secondary | ICD-10-CM | POA: Insufficient documentation

## 2015-01-02 DIAGNOSIS — O283 Abnormal ultrasonic finding on antenatal screening of mother: Secondary | ICD-10-CM

## 2015-01-02 DIAGNOSIS — Z3A35 35 weeks gestation of pregnancy: Secondary | ICD-10-CM

## 2015-01-02 DIAGNOSIS — Z3A34 34 weeks gestation of pregnancy: Secondary | ICD-10-CM | POA: Insufficient documentation

## 2015-01-02 DIAGNOSIS — O0933 Supervision of pregnancy with insufficient antenatal care, third trimester: Secondary | ICD-10-CM | POA: Insufficient documentation

## 2015-01-16 ENCOUNTER — Encounter (HOSPITAL_COMMUNITY): Payer: Self-pay

## 2015-01-19 LAB — OB RESULTS CONSOLE GBS: GBS: NEGATIVE

## 2015-01-23 ENCOUNTER — Inpatient Hospital Stay (HOSPITAL_COMMUNITY)
Admit: 2015-01-23 | Discharge: 2015-01-23 | Disposition: A | Discharge status: Home / Self Care | Payer: 59 | Source: Ambulatory Visit | Attending: Obstetrics & Gynecology | Admitting: Obstetrics & Gynecology

## 2015-01-23 ENCOUNTER — Encounter (HOSPITAL_COMMUNITY): Payer: Self-pay | Admitting: *Deleted

## 2015-01-23 DIAGNOSIS — Z3493 Encounter for supervision of normal pregnancy, unspecified, third trimester: Secondary | ICD-10-CM | POA: Diagnosis present

## 2015-01-23 LAB — URINALYSIS, ROUTINE W REFLEX MICROSCOPIC
Bilirubin Urine: NEGATIVE
GLUCOSE, UA: NEGATIVE mg/dL
Hgb urine dipstick: NEGATIVE
Ketones, ur: NEGATIVE mg/dL
Leukocytes, UA: NEGATIVE
Nitrite: NEGATIVE
Protein, ur: NEGATIVE mg/dL
Specific Gravity, Urine: 1.015 (ref 1.005–1.030)
pH: 6 (ref 5.0–8.0)

## 2015-01-23 NOTE — MAU Note (Signed)
PT  SAYS SHE  STARTED  HURTING  BAD  AT 2350.    VE IN OFFICE-  ON  Monday-  CLOSED.   DENIES HSV AND MRSA.  GBS-NEG

## 2015-01-23 NOTE — Discharge Instructions (Signed)
Braxton Hicks Contractions °Contractions of the uterus can occur throughout pregnancy. Contractions are not always a sign that you are in labor.  °WHAT ARE BRAXTON HICKS CONTRACTIONS?  °Contractions that occur before labor are called Braxton Hicks contractions, or false labor. Toward the end of pregnancy (32-34 weeks), these contractions can develop more often and may become more forceful. This is not true labor because these contractions do not result in opening (dilatation) and thinning of the cervix. They are sometimes difficult to tell apart from true labor because these contractions can be forceful and people have different pain tolerances. You should not feel embarrassed if you go to the hospital with false labor. Sometimes, the only way to tell if you are in true labor is for your health care provider to look for changes in the cervix. °If there are no prenatal problems or other health problems associated with the pregnancy, it is completely safe to be sent home with false labor and await the onset of true labor. °HOW CAN YOU TELL THE DIFFERENCE BETWEEN TRUE AND FALSE LABOR? °False Labor °· The contractions of false labor are usually shorter and not as hard as those of true labor.   °· The contractions are usually irregular.   °· The contractions are often felt in the front of the lower abdomen and in the groin.   °· The contractions may go away when you walk around or change positions while lying down.   °· The contractions get weaker and are shorter lasting as time goes on.   °· The contractions do not usually become progressively stronger, regular, and closer together as with true labor.   °True Labor °· Contractions in true labor last 30-70 seconds, become very regular, usually become more intense, and increase in frequency.   °· The contractions do not go away with walking.   °· The discomfort is usually felt in the top of the uterus and spreads to the lower abdomen and low back.   °· True labor can be  determined by your health care provider with an exam. This will show that the cervix is dilating and getting thinner.   °WHAT TO REMEMBER °· Keep up with your usual exercises and follow other instructions given by your health care provider.   °· Take medicines as directed by your health care provider.   °· Keep your regular prenatal appointments.   °· Eat and drink lightly if you think you are going into labor.   °· If Braxton Hicks contractions are making you uncomfortable:   °¨ Change your position from lying down or resting to walking, or from walking to resting.   °¨ Sit and rest in a tub of warm water.   °¨ Drink 2-3 glasses of water. Dehydration may cause these contractions.   °¨ Do slow and deep breathing several times an hour.   °WHEN SHOULD I SEEK IMMEDIATE MEDICAL CARE? °Seek immediate medical care if: °· Your contractions become stronger, more regular, and closer together.   °· You have fluid leaking or gushing from your vagina.   °· You have a fever.   °· You pass blood-tinged mucus.   °· You have vaginal bleeding.   °· You have continuous abdominal pain.   °· You have low back pain that you never had before.   °· You feel your baby's head pushing down and causing pelvic pressure.   °· Your baby is not moving as much as it used to.   °  °This information is not intended to replace advice given to you by your health care provider. Make sure you discuss any questions you have with your health care   provider. °  °Document Released: 01/24/2005 Document Revised: 01/29/2013 Document Reviewed: 11/05/2012 °Elsevier Interactive Patient Education ©2016 Elsevier Inc. ° °

## 2015-01-25 ENCOUNTER — Encounter (HOSPITAL_COMMUNITY): Payer: Self-pay

## 2015-01-25 ENCOUNTER — Inpatient Hospital Stay (HOSPITAL_COMMUNITY)
Admission: AD | Admit: 2015-01-25 | Discharge: 2015-01-26 | Disposition: A | Discharge status: Home / Self Care | Payer: 59 | Source: Ambulatory Visit | Attending: Obstetrics and Gynecology | Admitting: Obstetrics and Gynecology

## 2015-01-25 DIAGNOSIS — Z6841 Body Mass Index (BMI) 40.0 and over, adult: Secondary | ICD-10-CM

## 2015-01-25 DIAGNOSIS — O479 False labor, unspecified: Secondary | ICD-10-CM

## 2015-01-25 NOTE — MAU Note (Signed)
Pt reports contractions q 5 minutes, denies bleeding or ROM.

## 2015-01-26 ENCOUNTER — Inpatient Hospital Stay (HOSPITAL_COMMUNITY)
Admission: AD | Admit: 2015-01-26 | Discharge: 2015-01-27 | Disposition: A | Discharge status: Home / Self Care | Payer: 59 | Source: Ambulatory Visit | Attending: Obstetrics and Gynecology | Admitting: Obstetrics and Gynecology

## 2015-01-26 ENCOUNTER — Encounter (HOSPITAL_COMMUNITY): Payer: Self-pay | Admitting: *Deleted

## 2015-01-26 DIAGNOSIS — O479 False labor, unspecified: Secondary | ICD-10-CM

## 2015-01-26 DIAGNOSIS — Z6841 Body Mass Index (BMI) 40.0 and over, adult: Secondary | ICD-10-CM

## 2015-01-26 MED ORDER — NALBUPHINE HCL 10 MG/ML IJ SOLN
10.0000 mg | INTRAMUSCULAR | Status: DC | PRN
  Administered 2015-01-26: 10 mg via INTRAMUSCULAR
  Filled 2015-01-26: qty 1

## 2015-01-26 MED ORDER — PROMETHAZINE HCL 25 MG/ML IJ SOLN
25.0000 mg | INTRAMUSCULAR | Status: AC
  Administered 2015-01-26: 25 mg via INTRAMUSCULAR
  Filled 2015-01-26: qty 1

## 2015-01-26 NOTE — Discharge Instructions (Signed)
Braxton Hicks Contractions °Contractions of the uterus can occur throughout pregnancy. Contractions are not always a sign that you are in labor.  °WHAT ARE BRAXTON HICKS CONTRACTIONS?  °Contractions that occur before labor are called Braxton Hicks contractions, or false labor. Toward the end of pregnancy (32-34 weeks), these contractions can develop more often and may become more forceful. This is not true labor because these contractions do not result in opening (dilatation) and thinning of the cervix. They are sometimes difficult to tell apart from true labor because these contractions can be forceful and people have different pain tolerances. You should not feel embarrassed if you go to the hospital with false labor. Sometimes, the only way to tell if you are in true labor is for your health care provider to look for changes in the cervix. °If there are no prenatal problems or other health problems associated with the pregnancy, it is completely safe to be sent home with false labor and await the onset of true labor. °HOW CAN YOU TELL THE DIFFERENCE BETWEEN TRUE AND FALSE LABOR? °False Labor °· The contractions of false labor are usually shorter and not as hard as those of true labor.   °· The contractions are usually irregular.   °· The contractions are often felt in the front of the lower abdomen and in the groin.   °· The contractions may go away when you walk around or change positions while lying down.   °· The contractions get weaker and are shorter lasting as time goes on.   °· The contractions do not usually become progressively stronger, regular, and closer together as with true labor.   °True Labor °· Contractions in true labor last 30-70 seconds, become very regular, usually become more intense, and increase in frequency.   °· The contractions do not go away with walking.   °· The discomfort is usually felt in the top of the uterus and spreads to the lower abdomen and low back.   °· True labor can be  determined by your health care provider with an exam. This will show that the cervix is dilating and getting thinner.   °WHAT TO REMEMBER °· Keep up with your usual exercises and follow other instructions given by your health care provider.   °· Take medicines as directed by your health care provider.   °· Keep your regular prenatal appointments.   °· Eat and drink lightly if you think you are going into labor.   °· If Braxton Hicks contractions are making you uncomfortable:   °¨ Change your position from lying down or resting to walking, or from walking to resting.   °¨ Sit and rest in a tub of warm water.   °¨ Drink 2-3 glasses of water. Dehydration may cause these contractions.   °¨ Do slow and deep breathing several times an hour.   °WHEN SHOULD I SEEK IMMEDIATE MEDICAL CARE? °Seek immediate medical care if: °· Your contractions become stronger, more regular, and closer together.   °· You have fluid leaking or gushing from your vagina.   °· You have a fever.   °· You pass blood-tinged mucus.   °· You have vaginal bleeding.   °· You have continuous abdominal pain.   °· You have low back pain that you never had before.   °· You feel your baby's head pushing down and causing pelvic pressure.   °· Your baby is not moving as much as it used to.   °  °This information is not intended to replace advice given to you by your health care provider. Make sure you discuss any questions you have with your health care   provider. °  °Document Released: 01/24/2005 Document Revised: 01/29/2013 Document Reviewed: 11/05/2012 °Elsevier Interactive Patient Education ©2016 Elsevier Inc. ° °

## 2015-01-26 NOTE — MAU Provider Note (Signed)
History      24 yo, G1P0, 38.1 wks presents to MAU complaining of contractions  That started two days ago but have increased in frequency and intensity in the evening on 01/25/15.  Since presenting to the MAU at 2230 pt was checked and assessed to be 2.5/80/-2 . After 1.5 hrs of monitoring, she was rechecked and found to be 3/80-2.  Pt was given the option to discharge home or walk for 1 hour and be re-checked.   Pt unchanged after 1 hr of walking.  Denies LOf or VB. Reports +FM.    Patient Active Problem List   Diagnosis Date Noted  . Bipolar 1 disorder ([place]) 11/22/2014  . Late prenatal care--new dx pregnancy at 19 weeks 11/22/2014  . BMI 45.0-49.9, adult [facility]) 11/22/2014    Chief Complaint  Patient presents with  . Labor Eval   HPI  OB History    Gravida Para Term Preterm AB TAB SAB Ectopic Multiple Living   1               Past Medical History  Diagnosis Date  . Mental disorder   . Bipolar 1 disorder [facility])     Past Surgical History  Procedure Laterality Date  . No past surgeries      No family history on file.  Social History  Substance Use Topics  . Smoking status: Former Smoker    Quit date: 10/09/2011  . Smokeless tobacco: None  . Alcohol Use: Yes     Comment: a weekedn a month ; not while pregnant    Allergies: No Known Allergies  Prescriptions prior to admission  Medication Sig Dispense Refill Last Dose  . amoxicillin-clavulanate (AUGMENTIN) 875-125 MG per tablet Take 1 tablet by mouth 2 (two) times daily. Take all of this medication (Patient not taking: Reported on 01/02/2015) 20 tablet 0 Unknown at Unknown time  . fluconazole (DIFLUCAN) 150 MG tablet Take 1 tablet (150 mg total) by mouth once. (Patient not taking: Reported on 01/02/2015) 1 tablet 0 Unknown at Unknown time  . hydrOXYzine (ATARAX/VISTARIL) 10 MG tablet   2 More than a month at Unknown time  . lamoTRIgine (LAMICTAL) 100 MG tablet Take 100 mg by mouth daily.  2 More than a month at Unknown  time  . Prenatal Vit-Fe Fumarate-FA (PRENATAL VITAMIN PO) Take by mouth.   01/22/2015 at Unknown time    ROS Physical Exam   Blood pressure 123/70, pulse 99, temperature 97.2 F (36.2 C), temperature source Oral, resp. rate 18, height 5\' 4"  (1.626 m), weight 114.306 kg (252 lb), last menstrual period 05/04/2014, SpO2 99 %.   VE: 3/80/-2 FHT:  Cat 1 tracing, 135 bpm, moderate variability, +accels, no decels UC:  Regular every q4-6 min Membranes: intact  Physical Exam  Constitutional: She is oriented to person, place, and time. She appears well-developed and well-nourished.  HENT:  Head: Normocephalic.  Eyes: EOM are normal. Pupils are equal, round, and reactive to light.  Neck: Normal range of motion.  Cardiovascular: Normal rate and regular rhythm.   Respiratory: Effort normal.  GI: Soft. Bowel sounds are normal.  Genitourinary: Vagina normal.  Musculoskeletal: Normal range of motion.  Neurological: She is alert and oriented to person, place, and time.  Skin: Skin is warm and dry.  Psychiatric: She has a normal mood and affect. Her behavior is normal. Judgment and thought content normal.    ED Course  Assessment: IUP 38.1  Prodromal labor Membranes intact GBS negative Cat 1  tracing  Plan: Discharge home [facility] IM/[facility] IM Attend Routine OB visit scheduled today, 01/26/15 at 10 am Call if symptoms worsen or SROM is suspected   Lavetta Nielsen CNM, MSN 01/26/2015 2:06 AM

## 2015-01-26 NOTE — MAU Note (Signed)
Contractions started on the 17th, but pt thinks they are stronger and closer together. No bleeding or leaking per patient

## 2015-01-27 MED ORDER — PROMETHAZINE HCL 25 MG/ML IJ SOLN
25.0000 mg | INTRAMUSCULAR | Status: AC
  Administered 2015-01-27: 25 mg via INTRAMUSCULAR
  Filled 2015-01-27: qty 1

## 2015-01-27 MED ORDER — NALBUPHINE HCL 10 MG/ML IJ SOLN
10.0000 mg | INTRAMUSCULAR | Status: DC | PRN
  Administered 2015-01-27: 10 mg via INTRAMUSCULAR
  Filled 2015-01-27: qty 1

## 2015-01-27 NOTE — MAU Provider Note (Signed)
  History     24 yo, G1P0, 38.1 wks presents to MAU  Per request of midwife.  Pt complaining of increased pressure and contractions that have increased in intensity and frequency.  Reports contractions every 4-5 min.   Contractions started 3 days ago. Reports she is exhausted and feels contractions all in her back. Was in MAU last night and DC's early this morning.  Previous cervical exam 3/80-2.  Report +FM, and increased discharge but no lof or vb.   Patient Active Problem List   Diagnosis Date Noted  . Braxton Hick's contraction 01/26/2015  . Body mass index (BMI) 40.0-44.9, adult ([place]) 01/26/2015  . Bipolar 1 disorder ([place]) 11/22/2014  . Late prenatal care--new dx pregnancy at 35 weeks 11/22/2014  . BMI 45.0-49.9, adult [facility]) 11/22/2014    Chief Complaint  Patient presents with  . Contractions   HPI  OB History    Gravida Para Term Preterm AB TAB SAB Ectopic Multiple Living   1               Past Medical History  Diagnosis Date  . Mental disorder   . Bipolar 1 disorder [facility])     Past Surgical History  Procedure Laterality Date  . No past surgeries      History reviewed. No pertinent family history.  Social History  Substance Use Topics  . Smoking status: Former Smoker    Quit date: 10/09/2011  . Smokeless tobacco: None  . Alcohol Use: Yes     Comment: a weekedn a month ; not while pregnant    Allergies: No Known Allergies  Prescriptions prior to admission  Medication Sig Dispense Refill Last Dose  . hydrOXYzine (ATARAX/VISTARIL) 10 MG tablet   2 More than a month at Unknown time  . lamoTRIgine (LAMICTAL) 100 MG tablet Take 100 mg by mouth daily.  2 More than a month at Unknown time  . Prenatal Vit-Fe Fumarate-FA (PRENATAL VITAMIN PO) Take by mouth.   01/22/2015 at Unknown time    ROS Physical Exam   Blood pressure 112/77, pulse 98, temperature 98 F (36.7 C), temperature source Oral, resp. rate 18, height 5\' 4"  (1.626 m), weight 113.853 kg (251 lb),  last menstrual period 05/04/2014.    Physical Exam  Constitutional: She is oriented to person, place, and time. She appears well-developed and well-nourished.  HENT:  Head: Normocephalic and atraumatic.  Eyes: EOM are normal. Pupils are equal, round, and reactive to light.  Neck: Normal range of motion.  Cardiovascular: Normal rate and regular rhythm.   Respiratory: Effort normal and breath sounds normal.  GI: Soft. Bowel sounds are normal.  Musculoskeletal: Normal range of motion.  Neurological: She is alert and oriented to person, place, and time.  Skin: Skin is warm.  Psychiatric: She has a normal mood and affect. Her behavior is normal. Judgment and thought content normal.   SVE:  3/90-1  FHT:  Cat 1 tracing, 140 bpm, moderate variability, +accels, no decels UC:  Irregular every 4-5 min, palpate mild    ED Course  Assessment: IUP 38.1  Prodromal labor Membranes intact GBS negative Cat 1 tracing  Plan: Discharge home [facility] IM/[facility]erg[facility] IM Call if symptoms worsen or SROM is suspected   Lavetta Nielsen CNM, MSN 01/27/2015 12:21 AM

## 2015-01-27 NOTE — Discharge Instructions (Signed)
Braxton Hicks Contractions °Contractions of the uterus can occur throughout pregnancy. Contractions are not always a sign that you are in labor.  °WHAT ARE BRAXTON HICKS CONTRACTIONS?  °Contractions that occur before labor are called Braxton Hicks contractions, or false labor. Toward the end of pregnancy (32-34 weeks), these contractions can develop more often and may become more forceful. This is not true labor because these contractions do not result in opening (dilatation) and thinning of the cervix. They are sometimes difficult to tell apart from true labor because these contractions can be forceful and people have different pain tolerances. You should not feel embarrassed if you go to the hospital with false labor. Sometimes, the only way to tell if you are in true labor is for your health care provider to look for changes in the cervix. °If there are no prenatal problems or other health problems associated with the pregnancy, it is completely safe to be sent home with false labor and await the onset of true labor. °HOW CAN YOU TELL THE DIFFERENCE BETWEEN TRUE AND FALSE LABOR? °False Labor °· The contractions of false labor are usually shorter and not as hard as those of true labor.   °· The contractions are usually irregular.   °· The contractions are often felt in the front of the lower abdomen and in the groin.   °· The contractions may go away when you walk around or change positions while lying down.   °· The contractions get weaker and are shorter lasting as time goes on.   °· The contractions do not usually become progressively stronger, regular, and closer together as with true labor.   °True Labor °· Contractions in true labor last 30-70 seconds, become very regular, usually become more intense, and increase in frequency.   °· The contractions do not go away with walking.   °· The discomfort is usually felt in the top of the uterus and spreads to the lower abdomen and low back.   °· True labor can be  determined by your health care provider with an exam. This will show that the cervix is dilating and getting thinner.   °WHAT TO REMEMBER °· Keep up with your usual exercises and follow other instructions given by your health care provider.   °· Take medicines as directed by your health care provider.   °· Keep your regular prenatal appointments.   °· Eat and drink lightly if you think you are going into labor.   °· If Braxton Hicks contractions are making you uncomfortable:   °¨ Change your position from lying down or resting to walking, or from walking to resting.   °¨ Sit and rest in a tub of warm water.   °¨ Drink 2-3 glasses of water. Dehydration may cause these contractions.   °¨ Do slow and deep breathing several times an hour.   °WHEN SHOULD I SEEK IMMEDIATE MEDICAL CARE? °Seek immediate medical care if: °· Your contractions become stronger, more regular, and closer together.   °· You have fluid leaking or gushing from your vagina.   °· You have a fever.   °· You pass blood-tinged mucus.   °· You have vaginal bleeding.   °· You have continuous abdominal pain.   °· You have low back pain that you never had before.   °· You feel your baby's head pushing down and causing pelvic pressure.   °· Your baby is not moving as much as it used to.   °  °This information is not intended to replace advice given to you by your health care provider. Make sure you discuss any questions you have with your health care   provider. °  °Document Released: 01/24/2005 Document Revised: 01/29/2013 Document Reviewed: 11/05/2012 °Elsevier Interactive Patient Education ©2016 Elsevier Inc. ° °

## 2015-01-28 ENCOUNTER — Inpatient Hospital Stay (HOSPITAL_COMMUNITY)
Admission: AD | Admit: 2015-01-28 | Discharge: 2015-01-30 | DRG: 775 | Disposition: A | Discharge status: Home / Self Care | Payer: 59 | Source: Ambulatory Visit | Attending: Obstetrics and Gynecology | Admitting: Obstetrics and Gynecology

## 2015-01-28 ENCOUNTER — Inpatient Hospital Stay (HOSPITAL_COMMUNITY): Payer: 59 | Admitting: Anesthesiology

## 2015-01-28 ENCOUNTER — Encounter (HOSPITAL_COMMUNITY): Payer: Self-pay

## 2015-01-28 DIAGNOSIS — Z3A38 38 weeks gestation of pregnancy: Secondary | ICD-10-CM

## 2015-01-28 DIAGNOSIS — F3181 Bipolar II disorder: Secondary | ICD-10-CM | POA: Diagnosis present

## 2015-01-28 DIAGNOSIS — O99344 Other mental disorders complicating childbirth: Secondary | ICD-10-CM | POA: Diagnosis present

## 2015-01-28 DIAGNOSIS — Z3490 Encounter for supervision of normal pregnancy, unspecified, unspecified trimester: Secondary | ICD-10-CM

## 2015-01-28 DIAGNOSIS — Z87891 Personal history of nicotine dependence: Secondary | ICD-10-CM | POA: Diagnosis not present

## 2015-01-28 DIAGNOSIS — F4329 Adjustment disorder with other symptoms: Secondary | ICD-10-CM | POA: Diagnosis not present

## 2015-01-28 HISTORY — DX: Personal history of nicotine dependence: Z87.891

## 2015-01-28 LAB — TYPE AND SCREEN
ABO/RH(D): A POS
Antibody Screen: NEGATIVE

## 2015-01-28 LAB — CBC
HCT: 33.9 % — ABNORMAL LOW (ref 36.0–46.0)
HCT: 31.7 % — ABNORMAL LOW (ref 36.0–46.0)
Hemoglobin: 11.2 g/dL — ABNORMAL LOW (ref 12.0–15.0)
Hemoglobin: 10.1 g/dL — ABNORMAL LOW (ref 12.0–15.0)
MCH: 26.2 pg (ref 26.0–34.0)
MCH: 25.7 pg — ABNORMAL LOW (ref 26.0–34.0)
MCHC: 33 g/dL (ref 30.0–36.0)
MCHC: 31.9 g/dL (ref 30.0–36.0)
MCV: 79.2 fL (ref 78.0–100.0)
MCV: 80.7 fL (ref 78.0–100.0)
PLATELETS: 406 10*3/uL — AB (ref 150–400)
Platelets: 400 10*3/uL (ref 150–400)
RBC: 4.28 MIL/uL (ref 3.87–5.11)
RBC: 3.93 MIL/uL (ref 3.87–5.11)
RDW: 15.4 % (ref 11.5–15.5)
RDW: 15.7 % — AB (ref 11.5–15.5)
WBC: 24.6 10*3/uL — ABNORMAL HIGH (ref 4.0–10.5)
WBC: 24 10*3/uL — ABNORMAL HIGH (ref 4.0–10.5)

## 2015-01-28 LAB — ABO/RH: ABO/RH(D): A POS

## 2015-01-28 LAB — RPR: RPR: NONREACTIVE

## 2015-01-28 MED ORDER — SIMETHICONE 80 MG PO CHEW: 80.0000 mg | CHEWABLE_TABLET | ORAL | Status: DC | PRN

## 2015-01-28 MED ORDER — ONDANSETRON HCL 4 MG PO TABS: 4.0000 mg | ORAL_TABLET | ORAL | Status: DC | PRN

## 2015-01-28 MED ORDER — ZOLPIDEM TARTRATE 5 MG PO TABS: 5.0000 mg | ORAL_TABLET | Freq: Every evening | ORAL | Status: DC | PRN

## 2015-01-28 MED ORDER — FENTANYL 2.5 MCG/ML BUPIVACAINE 1/10 % EPIDURAL INFUSION (WH - ANES)
14.0000 mL/h | INTRAMUSCULAR | Status: DC | PRN
  Administered 2015-01-28: 14 mL/h via EPIDURAL

## 2015-01-28 MED ORDER — PRENATAL MULTIVITAMIN CH
1.0000 | ORAL_TABLET | Freq: Every day | ORAL | Status: DC
  Administered 2015-01-29 – 2015-01-30 (×2): 1 via ORAL
  Filled 2015-01-28 (×2): qty 1

## 2015-01-28 MED ORDER — LIDOCAINE HCL (PF) 1 % IJ SOLN
INTRAMUSCULAR | Status: DC | PRN
  Administered 2015-01-28 (×2): 4 mL via EPIDURAL

## 2015-01-28 MED ORDER — BENZOCAINE-MENTHOL 20-0.5 % EX AERO: 1.0000 "application " | INHALATION_SPRAY | CUTANEOUS | Status: DC | PRN

## 2015-01-28 MED ORDER — OXYTOCIN BOLUS FROM INFUSION
500.0000 mL | INTRAVENOUS | Status: DC
  Administered 2015-01-28: 500 mL via INTRAVENOUS

## 2015-01-28 MED ORDER — ONDANSETRON HCL 4 MG/2ML IJ SOLN: 4.0000 mg | Freq: Four times a day (QID) | INTRAMUSCULAR | Status: DC | PRN

## 2015-01-28 MED ORDER — EPHEDRINE 5 MG/ML INJ
10.0000 mg | INTRAVENOUS | Status: DC | PRN
  Filled 2015-01-28: qty 2

## 2015-01-28 MED ORDER — SENNOSIDES-DOCUSATE SODIUM 8.6-50 MG PO TABS
2.0000 | ORAL_TABLET | ORAL | Status: DC
  Administered 2015-01-29: 2 via ORAL
  Filled 2015-01-28 (×2): qty 2

## 2015-01-28 MED ORDER — OXYTOCIN 40 UNITS IN LACTATED RINGERS INFUSION - SIMPLE MED
62.5000 mL/h | INTRAVENOUS | Status: DC
  Filled 2015-01-28: qty 1000

## 2015-01-28 MED ORDER — LANOLIN HYDROUS EX OINT: TOPICAL_OINTMENT | CUTANEOUS | Status: DC | PRN

## 2015-01-28 MED ORDER — OXYCODONE-ACETAMINOPHEN 5-325 MG PO TABS: 1.0000 | ORAL_TABLET | ORAL | Status: DC | PRN

## 2015-01-28 MED ORDER — CITRIC ACID-SODIUM CITRATE 334-500 MG/5ML PO SOLN: 30.0000 mL | ORAL | Status: DC | PRN

## 2015-01-28 MED ORDER — PHENYLEPHRINE 40 MCG/ML (10ML) SYRINGE FOR IV PUSH (FOR BLOOD PRESSURE SUPPORT)
80.0000 ug | PREFILLED_SYRINGE | INTRAVENOUS | Status: DC | PRN
  Filled 2015-01-28: qty 2

## 2015-01-28 MED ORDER — LACTATED RINGERS IV SOLN
500.0000 mL | INTRAVENOUS | Status: DC | PRN
  Administered 2015-01-28 (×2): 500 mL via INTRAVENOUS

## 2015-01-28 MED ORDER — IBUPROFEN 600 MG PO TABS
600.0000 mg | ORAL_TABLET | Freq: Four times a day (QID) | ORAL | Status: DC
Start: 2015-01-28 — End: 2015-01-30
  Administered 2015-01-28 – 2015-01-30 (×10): 600 mg via ORAL
  Filled 2015-01-28 (×10): qty 1

## 2015-01-28 MED ORDER — OXYCODONE-ACETAMINOPHEN 5-325 MG PO TABS: 2.0000 | ORAL_TABLET | ORAL | Status: DC | PRN

## 2015-01-28 MED ORDER — ACETAMINOPHEN 325 MG PO TABS: 650.0000 mg | ORAL_TABLET | ORAL | Status: DC | PRN

## 2015-01-28 MED ORDER — PHENYLEPHRINE 40 MCG/ML (10ML) SYRINGE FOR IV PUSH (FOR BLOOD PRESSURE SUPPORT)
PREFILLED_SYRINGE | INTRAVENOUS | Status: AC
  Filled 2015-01-28: qty 20

## 2015-01-28 MED ORDER — WITCH HAZEL-GLYCERIN EX PADS: 1.0000 "application " | MEDICATED_PAD | CUTANEOUS | Status: DC | PRN

## 2015-01-28 MED ORDER — LIDOCAINE HCL (PF) 1 % IJ SOLN
30.0000 mL | INTRAMUSCULAR | Status: AC | PRN
  Administered 2015-01-28: 30 mL via SUBCUTANEOUS
  Filled 2015-01-28: qty 30

## 2015-01-28 MED ORDER — ONDANSETRON HCL 4 MG/2ML IJ SOLN: 4.0000 mg | INTRAMUSCULAR | Status: DC | PRN

## 2015-01-28 MED ORDER — FENTANYL 2.5 MCG/ML BUPIVACAINE 1/10 % EPIDURAL INFUSION (WH - ANES)
INTRAMUSCULAR | Status: AC
  Filled 2015-01-28: qty 125

## 2015-01-28 MED ORDER — TETANUS-DIPHTH-ACELL PERTUSSIS 5-2.5-18.5 LF-MCG/0.5 IM SUSP
0.5000 mL | Freq: Once | INTRAMUSCULAR | Status: DC
Start: 2015-01-29 — End: 2015-01-30

## 2015-01-28 MED ORDER — DIPHENHYDRAMINE HCL 25 MG PO CAPS: 25.0000 mg | ORAL_CAPSULE | Freq: Four times a day (QID) | ORAL | Status: DC | PRN

## 2015-01-28 MED ORDER — LACTATED RINGERS IV SOLN
INTRAVENOUS | Status: DC
  Administered 2015-01-28: 02:00:00 via INTRAVENOUS

## 2015-01-28 MED ORDER — DIPHENHYDRAMINE HCL 50 MG/ML IJ SOLN: 12.5000 mg | INTRAMUSCULAR | Status: DC | PRN

## 2015-01-28 MED ORDER — DIBUCAINE 1 % RE OINT: 1.0000 "application " | TOPICAL_OINTMENT | RECTAL | Status: DC | PRN

## 2015-01-28 MED ORDER — FLEET ENEMA 7-19 GM/118ML RE ENEM: 1.0000 | ENEMA | RECTAL | Status: DC | PRN

## 2015-01-28 NOTE — Anesthesia Preprocedure Evaluation (Signed)
Anesthesia Evaluation  Patient identified by MRN, date of birth, ID band Patient awake    Reviewed: Allergy & Precautions, H&P , Patient's Chart, lab work & pertinent test results  Airway Mallampati: II  TM Distance: >3 FB Neck ROM: full    Dental no notable dental hx. (+) Teeth Intact   Pulmonary neg pulmonary ROS, former smoker,    Pulmonary exam normal breath sounds clear to auscultation       Cardiovascular negative cardio ROS Normal cardiovascular exam Rhythm:regular Rate:Normal     Neuro/Psych PSYCHIATRIC DISORDERS Bipolar Disorder negative neurological ROS     GI/Hepatic negative GI ROS, Neg liver ROS,   Endo/Other  negative endocrine ROS  Renal/GU negative Renal ROS  negative genitourinary   Musculoskeletal negative musculoskeletal ROS (+)   Abdominal   Peds  Hematology negative hematology ROS (+) anemia ,   Anesthesia Other Findings   Reproductive/Obstetrics (+) Pregnancy                             Anesthesia Physical Anesthesia Plan  ASA: III  Anesthesia Plan: Epidural   Post-op Pain Management:    Induction:   Airway Management Planned: Natural Airway  Additional Equipment:   Intra-op Plan:   Post-operative Plan:   Informed Consent: I have reviewed the patients History and Physical, chart, labs and discussed the procedure including the risks, benefits and alternatives for the proposed anesthesia with the patient or authorized representative who has indicated his/her understanding and acceptance.     Plan Discussed with: Anesthesiologist  Anesthesia Plan Comments:         Anesthesia Quick Evaluation

## 2015-01-28 NOTE — Anesthesia Postprocedure Evaluation (Signed)
Anesthesia Post Note  Patient: Anne Marks  Procedure(s) Performed: * No procedures listed *  Patient location during evaluation: L&D Anesthesia Type: Epidural Level of consciousness: awake and alert and oriented Pain management: pain level controlled Vital Signs Assessment: post-procedure vital signs reviewed and stable Respiratory status: spontaneous breathing and respiratory function stable Cardiovascular status: blood pressure returned to baseline Postop Assessment: no headache, no backache, patient able to bend at knees, no signs of nausea or vomiting and adequate PO intake Anesthetic complications: no    Last Vitals:  Filed Vitals:   01/28/15 0901 01/28/15 0931  BP: 122/69 130/75  Pulse: 89 90  Temp:    Resp:      Last Pain:  Filed Vitals:   01/28/15 0931  PainSc: 10-Worst pain ever                 Everrett Lacasse

## 2015-01-28 NOTE — Anesthesia Procedure Notes (Signed)
Epidural Patient location during procedure: OB Start time: 01/28/2015 2:48 AM  Staffing Anesthesiologist: Josephine Igo Performed by: anesthesiologist   Preanesthetic Checklist Completed: patient identified, site marked, surgical consent, pre-op evaluation, timeout performed, IV checked, risks and benefits discussed and monitors and equipment checked  Epidural Patient position: sitting Prep: site prepped and draped and DuraPrep Patient monitoring: continuous pulse ox and blood pressure Approach: midline Location: L3-L4 Injection technique: LOR air  Needle:  Needle type: Tuohy  Needle gauge: 17 G Needle length: 9 cm and 9 Needle insertion depth: 8 cm Catheter type: closed end flexible Catheter size: 19 Gauge Catheter at skin depth: 13 cm Test dose: negative and Other  Assessment Events: blood not aspirated, injection not painful, no injection resistance, negative IV test and no paresthesia  Additional Notes Patient identified. Risks and benefits discussed including failed block, incomplete  Pain control, post dural puncture headache, nerve damage, paralysis, blood pressure Changes, nausea, vomiting, reactions to medications-both toxic and allergic and post Partum back pain. All questions were answered. Patient expressed understanding and wished to proceed. Sterile technique was used throughout procedure. Epidural site was Dressed with sterile barrier dressing. No paresthesias, signs of intravascular injection Or signs of intrathecal spread were encountered.  Patient was more comfortable after the epidural was dosed. Please see RN's note for documentation of vital signs and FHR which are stable.

## 2015-01-28 NOTE — Clinical Social Work Maternal (Signed)
CLINICAL SOCIAL WORK MATERNAL/CHILD NOTE  Patient Details  Name: Anne Marks MRN: [id number] Date of Birth: Jan 27, 1991  Date:  01/28/2015  Clinical Social Worker Initiating Note:  Lucita Ferrara MSW, LCSW Date/ Time Initiated:  01/28/15/1430    Child's Name:  Festus Holts   Legal Guardian:  Alwyn Ren   Need for Interpreter:  None   Date of Referral:  01/28/15     Reason for Referral:  Adoption    Referral Source:  [facility]   Address:  [address] 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  Patient had friend visiting her upon [place] arrival, but friends willingly provided CSW and MOB opportunity to complete assessment in privacy.   Patient presented as easily engaged and receptive to the visit. She presented in a pleasant mood, displayed a full range in affect, and openly processed her thoughts and feelings secondary to intention to place the infant up for the adoption and how she has felt during the childbirth experience.  Patient was observed to be attending to and caring for the infant during the entire visit.   Patient  shared belief that she is doing "better" than she anticipated. She reported that she thought that she would feel sadder, and discussed how she anticipated crying tears of sadness upon the infant's birth since she knows that she will not be discharging home with the infant. Patient reported that she felt happy upon the infant's birth as she feels that the infant is "perfect" and continues to be in awe that she gave birth to an infant.  Patient stated that she wants to room-in with the infant and care for her until they are both medically ready for discharge.   Per patient, she learned that she was pregnant in October, and stated that it was unanticipated and unplanned since she had a permanent form of birth control.  Patient openly discussed range of mixed emotions and thoughts when she learned that she was pregnant, and shared that she was concerned about her readiness to parent since she is currently in college and does not believe that it would be best for her and the infant.  Patient reflected upon the numerous options she considered, and stated that she decided to pursue an open adoption with [place] and the prospective adoptive family is a friend of her friends.  She stated that the adoptive parents are currently in [place] (and have visited), and she feels that she is not giving up the infant, but rather believes that she is providing an infant to a family who is able to provide to her  in ways that she does not believe she can.   Patient shared that she has spoken to other birth mothers, and is anticipating a range of emotions when she is discharged without the infant.  Patient shared that she knows that it will be difficult, and reported that she anticipates that it will be overwhelming.  She expressed belief that she has a strong support system from the father of the infant, her parents, and her friends.   Patient reported that she does not want to begin to complete necessary paperwork or  sign HIPPA consents.  Patient requested that CSW return on 12/22 to continue the process.   Per patient, she noted concerns about her mental health during her freshman year of college. She stated that her roommate recommended that she follow up with the psychology clinic, and patient reported that she was diagnosed with bipolar II in the spring of 2015.  Patient stated that she currently has a therapist and a psychiatrist, and reported that she was previously prescribed Lamictal and Vistaril. Patient reported that she self-discontinued when she learned of the pregnancy.  Patient presented with strong insight and sense of self-awareness as she was able to discuss her symptoms, manic episodes, and depressive episodes.  Patient shared that she noted mixed episodes during the pregnancy without the medications, and shared that she last felt that she had a manic episode 2-3 weeks ago, and last felt depressed on 12/17.  Patient openly discussed how her mental health was closely linked to how she felt about placing the infant up for adoption, and shared that she experienced periods where she did not want to place the infant up for adoption.  Patient stated that when she does not feel depressed or manic, she feels confident in her desire to sign adoption paperwork.   Patient denied any current symptoms of depression or mania, and shared belief that contractions and labor served as a distraction.   Patient expressed intention to follow up with her mental health providers postpartum.  Patient shared that she is aware of her increased risk for ongoing mental health complications based on her mental health history, how she felt during the pregnancy, and the emotional experienced associated with an adoption.  Patient expressed ability to contact her psychiatrist in order to re-start medications as soon as possible.  Patient also reported that she will be able to continue to receive support from her adoption counselor until  the infant is one year old.    Patient denied current questions, concerns, or needs at this time. She denied need for additional support at this time.  CSW reviewed role on MBU, and patient expressed appreciation for presence and support.  Patient inquired about how to best contact CSW, CSW provided contact information.    CSW Plan/Description:   1. Patient/Family Education-- perinatal mood and anxiety disorders  2. Psychosocial Support and Ongoing Assessment of Needs--CSW to follow up with patient on 12/22 in order to continue to provide support and assess level of readiness to initiate and sign adoption papers.    Sheilah Mins, LCSW 01/28/2015, 3:59 PM

## 2015-01-28 NOTE — H&P (Signed)
Anne Marks is a 24 y.o. female, G1P0 at 38.3 weeks, admitted for contractions q 3-4 minutes and SROM at 0153.     Plans an open adoption through an Independent adoption center with couple from [place].  Unexpected dx of pregnancy with Skyla in place 11/21/14 (28 wks) --no IUD string visible at os.  Per [facility] Chart, If IUD not identified at delivery, pt will need Xray before DC from hospital   Patient Active Problem List   Diagnosis Date Noted  . Indication for care in labor or delivery 01/28/2015  . Braxton Hick's contraction 01/26/2015  . Body mass index (BMI) 40.0-44.9, adult ([place]) 01/26/2015  . Bipolar 1 disorder ([place]) 11/22/2014  . Late prenatal care--new dx pregnancy at 96 weeks 11/22/2014  . BMI 45.0-49.9, adult ([place]) 11/22/2014    History of present pregnancy: Patient entered care at 29.4 weeks.   EDC of 02/08/15 was established by LMP and [place] at 28.5wks.    Anatomy scan:  31.1 weeks, with normal findings and an posterior placenta.   Singleton pregnancy. Vertex presentation,  Posterior placental edge is 9.5cm from internal OS- normal. Cervix is closed. AFI normal - 55th%. 4 chamber, RVOT, LVOT,  Aortic Arch, ductal arch, profile, nasal bone, open hands, right arm.  Spine 5th digit, cranial anatomy, placental cord insertion, gender not well visualized due to advanced  Gestational age, maternal body habitus and position.  Additional [place] evaluations:   33.3wks   (12/24/14) OB US F/U: Girl. Single. Vertex. Posterior placenta. Cx appears closed. Fluid appears normal. Unable to visualize aortic arch, ductal arch, L humerus, L radial and ulnar bone d/t fetal position and advanced gest. age. EFW 1808 g, 26%  34.5 wks (01/02/15) MFM [place]:   Seen due to lagging growth, limited views of feta anatomy due to lae gestational age and maternal habitus, EFW 43rd%, AC measures at 19th%. Normal amniotic fluid   Significant prenatal events:    Late to prenatal care. Unexpected dx of pregnancy with Skyla  in place 11/21/14 (28 wks) --no IUD string visible at os.   Stopped taking Lamictal and Hydroxyzine when she found out she was pregnant.    Last evaluation:  01/26/15  [place], [place].    Vertex, 140 bpm,  4/80/-3,  BP100/70     OB History    Gravida Para Term Preterm AB TAB SAB Ectopic Multiple Living   1              Past Medical History  Diagnosis Date  . Mental disorder   . Bipolar 1 disorder [facility])    Past Surgical History  Procedure Laterality Date  . No past surgeries     Family History: family history is not on file. Social History:  reports that she quit smoking about 3 years ago. She does not have any smokeless tobacco history on file. She reports that she drinks alcohol. She reports that she does not use illicit drugs.  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   Plans an open adoption through an Independent adoption center with couple from [place].   Prenatal Transfer Tool  Maternal Diabetes: No Genetic Screening: Declined Maternal Ultrasounds/Referrals: Normal  Fetal Ultrasounds or other Referrals:  Referred to Materal Fetal Medicine  -  Lagging AC Maternal Substance Abuse:  No Significant Maternal Medications:  None Significant Maternal Lab Results: Lab values include: Group B Strep negative  TDAP 12/24/14 Flu  12/24/14   ROS:  Ctx, leaking of fluid,  SROM at 0153, +FM  No Known Allergies   Dilation: 6.5 Effacement (%): 90 Station: 0 Exam by:: B Boyer RN  Blood pressure 155/96, pulse 91, temperature 97.6 F (36.4 C), temperature source Oral, resp. rate 20, last menstrual period 05/04/2014, SpO2 97 %.  Chest clear Heart RRR without murmur Abd gravid, NT, FH 40cm Ext: wnl  FHR: Category 1, 140 bpm, moderate variability, +accels, no decels UCs:  Regular, every 2-4 min  Prenatal labs: ABO, Rh: A/Positive/-- (10/20 0000) Antibody: Negative (10/20 0000) Rubella:  !Error!   Immune RPR: Nonreactive (10/20 0000)  HBsAg:  Negative (10/20 0000)  HIV: Non-reactive (10/20 0000)  GBS: Negative (12/12 0000) Sickle cell/Hgb electrophoresis:  % Pap:  wnl 03/06/14 GC:  Negative 01/19/15 Chlamydia:  Negative 01/19/15 Genetic screenings:   None- late to prenatal care Glucola:  Low (57)  Other:   Hgb 10.1 at NOB 9.9 at 28 weeks    Assessment/Plan: IUP at 38.3 Late to prenatal care Pregnancy with IUD  Pregnancy with adoption planned Body mass index  40.0 Bipolar 1 disorder, not on meds Cat 1 FHT SROM 0153 (01/28/15)    Plan: Admit to [place] per consult with Dr. Cletis Media Routine [facility] orders Pain med/epidural prn If IUD not identified at delivery, pt will need Xray before DC from [place], [place] 01/28/2015, 3:19 AM

## 2015-01-29 NOTE — Progress Notes (Signed)
Psychiatry called about a consult. AVS

## 2015-01-29 NOTE — Discharge Summary (Addendum)
OB Discharge Summary  Patient Name: Anne Marks DOB: 1990/06/17 MRN: [id number]  Date of admission: 01/28/2015 Delivering MD: Lavetta Nielsen   Date of discharge: 01/29/2015  Admitting diagnosis: 39wks, contractions worse Intrauterine pregnancy: [redacted]w[redacted]d     Secondary diagnosis:Principal Problem:   Spontaneous vaginal delivery Active Problems:   Indication for care in labor or delivery   Pregnancy with adoption planned  Additional problems:Hx of bipolor     Discharge diagnosis: Term Pregnancy Delivered                                                                     Post partum procedures:none  Augmentation: none  Complications: None  Hospital course:  Onset of Labor With Vaginal Delivery     24 y.o. yo G1P1001 at [redacted]w[redacted]d was admitted in Active Laboron 01/28/2015. Patient had an uncomplicated labor course as follows:  Membrane Rupture Time/Date: 1:53 AM ,01/28/2015   Intrapartum Procedures: Episiotomy: None [1]                                         Lacerations:  1st degree [2];Perineal [11];Periurethral [8]  Patient had a delivery of a Viable infant. 01/28/2015  Information for the patient's newborn:  Kenidi, Insley [id number]  Delivery Method: Vaginal, Spontaneous Delivery (Filed from Delivery Summary)    Pateint had an uncomplicated postpartum course.  She is ambulating, tolerating a regular diet, passing flatus, and urinating well. Patient is discharged home in stable condition on No discharge date for patient encounter.Marland Kitchen    Physical exam  Filed Vitals:   01/28/15 0931 01/28/15 1420 01/28/15 1813 01/29/15 0603  BP: 130/75 118/71 120/68 114/46  Pulse: 90 95 87 68  Temp:   97.8 F (36.6 C) 97.8 F (36.6 C)  TempSrc:   Oral Oral  Resp:  18 20 18   Height:      Weight:      SpO2:       General: alert and cooperative Lochia: appropriate Uterine Fundus: firm Incision: Healing well with no significant drainage DVT Evaluation: No evidence of DVT seen on  physical exam. Labs: Lab Results  Component Value Date   WBC 24.0* 01/28/2015   HGB 10.1* 01/28/2015   HCT 31.7* 01/28/2015   MCV 80.7 01/28/2015   PLT 400 01/28/2015   No flowsheet data found.  Discharge instruction: per After Visit Summary and "Baby and Me Booklet".  Pt reports she will be see her regular psychiatrist Dr Lenor Coffin to manage her medication   Medications:  Current facility-administered medications:  .  acetaminophen (TYLENOL) tablet 650 mg, 650 mg, Oral, Q4H PRN, Lavetta Nielsen, CNM .  benzocaine-Menthol (DERMOPLAST) 20-0.5 % topical spray 1 application, 1 application, Topical, PRN, Lavetta Nielsen, CNM .  witch hazel-glycerin (TUCKS) pad 1 application, 1 application, Topical, PRN **AND** dibucaine (NUPERCAINAL) 1 % rectal ointment 1 application, 1 application, Rectal, PRN, Lavetta Nielsen, CNM .  diphenhydrAMINE (BENADRYL) capsule 25 mg, 25 mg, Oral, Q6H PRN, Lavetta Nielsen, CNM .  ibuprofen (ADVIL,MOTRIN) tablet 600 mg, 600 mg, Oral, 4 times per day, Lavetta Nielsen, CNM, 600 mg at 01/29/15 0740 .  lanolin  ointment, , Topical, PRN, Lavetta Nielsen, CNM .  lidocaine (PF) (XYLOCAINE) 1 % injection 30 mL, 30 mL, Subcutaneous, PRN, Delsa Bern, MD, 30 mL at 01/28/15 0423 .  ondansetron (ZOFRAN) tablet 4 mg, 4 mg, Oral, Q4H PRN **OR** ondansetron (ZOFRAN) injection 4 mg, 4 mg, Intravenous, Q4H PRN, Lavetta Nielsen, CNM .  oxyCODONE-acetaminophen (PERCOCET/ROXICET) 5-325 MG per tablet 1 tablet, 1 tablet, Oral, Q4H PRN, Lavetta Nielsen, CNM .  oxyCODONE-acetaminophen (PERCOCET/ROXICET) 5-325 MG per tablet 2 tablet, 2 tablet, Oral, Q4H PRN, Lavetta Nielsen, CNM .  prenatal multivitamin tablet 1 tablet, 1 tablet, Oral, Q1200, Lavetta Nielsen, CNM .  senna-docusate (Senokot-S) tablet 2 tablet, 2 tablet, Oral, Q24H, Lavetta Nielsen, CNM, 2 tablet at 01/29/15 0021 .  simethicone (MYLICON) chewable tablet 80 mg, 80 mg, Oral, PRN, Lavetta Nielsen, CNM .  Tdap (BOOSTRIX) injection 0.5 mL, 0.5 mL,  Intramuscular, Once, Lavetta Nielsen, CNM .  zolpidem (AMBIEN) tablet 5 mg, 5 mg, Oral, QHS PRN, Lavetta Nielsen, CNM After Visit Meds:    Medication List    ASK your doctor about these medications        acetaminophen 325 MG tablet  Commonly known as:  TYLENOL  Take 650 mg by mouth every 6 (six) hours as needed for mild pain.     PRENATAL VITAMIN PO  Take by mouth.        Diet: routine diet  Activity: Advance as tolerated. Pelvic rest for 6 weeks.   Outpatient follow up:6 weeks Follow up Appt:No future appointments. Follow up visit: No Follow-up on file.  Postpartum contraception: None  Newborn Data: Live born female  Birth Weight: 5 lb 8.4 oz (2506 g) APGAR: 8, 9  Baby Feeding: None, Baby up for adoption Disposition:Home with adoptive parents  01/30/15 Jacarius Handel, CNM      Postpartum Care After Vaginal Delivery  After you deliver your newborn (postpartum period), the usual stay in the hospital is 24 72 hours. If there were problems with your labor or delivery, or if you have other medical problems, you might be in the hospital longer.  While you are in the hospital, you will receive help and instructions on how to care for yourself and your newborn during the postpartum period.  While you are in the hospital:  Be sure to tell your nurses if you have pain or discomfort, as well as where you feel the pain and what makes the pain worse.  If you had an incision made near your vagina (episiotomy) or if you had some tearing during delivery, the nurses may put ice packs on your episiotomy or tear. The ice packs may help to reduce the pain and swelling.  If you are breastfeeding, you may feel uncomfortable contractions of your uterus for a couple of weeks. This is normal. The contractions help your uterus get back to normal size.  It is normal to have some bleeding after delivery.  For the first 1 3 days after delivery, the flow is red and the amount may be similar  to a period.  It is common for the flow to start and stop.  In the first few days, you may pass some small clots. Let your nurses know if you begin to pass large clots or your flow increases.  Do not  flush blood clots down the toilet before having the nurse look at them.  During the next 3 10 days after delivery, your flow should become more watery and pink or brown-tinged in color.  Ten  to fourteen days after delivery, your flow should be a small amount of yellowish-white discharge.  The amount of your flow will decrease over the first few weeks after delivery. Your flow may stop in 6 8 weeks. Most women have had their flow stop by 12 weeks after delivery.  You should change your sanitary pads frequently.  Wash your hands thoroughly with soap and water for at least 20 seconds after changing pads, using the toilet, or before holding or feeding your newborn.  You should feel like you need to empty your bladder within the first 6 8 hours after delivery.  In case you become weak, lightheaded, or faint, call your nurse before you get out of bed for the first time and before you take a shower for the first time.  Within the first few days after delivery, your breasts may begin to feel tender and full. This is called engorgement. Breast tenderness usually goes away within 48 72 hours after engorgement occurs. You may also notice milk leaking from your breasts. If you are not breastfeeding, do not stimulate your breasts. Breast stimulation can make your breasts produce more milk.  Spending as much time as possible with your newborn is very important. During this time, you and your newborn can feel close and get to know each other. Having your newborn stay in your room (rooming in) will help to strengthen the bond with your newborn. It will give you time to get to know your newborn and become comfortable caring for your newborn.  Your hormones change after delivery. Sometimes the hormone changes  can temporarily cause you to feel sad or tearful. These feelings should not last more than a few days. If these feelings last longer than that, you should talk to your caregiver.  If desired, talk to your caregiver about methods of family planning or contraception.  Talk to your caregiver about immunizations. Your caregiver may want you to have the following immunizations before leaving the hospital:  Tetanus, diphtheria, and pertussis (Tdap) or tetanus and diphtheria (Td) immunization. It is very important that you and your family (including grandparents) or others caring for your newborn are up-to-date with the Tdap or Td immunizations. The Tdap or Td immunization can help protect your newborn from getting ill.  Rubella immunization.  Varicella (chickenpox) immunization.  Influenza immunization. You should receive this annual immunization if you did not receive the immunization during your pregnancy. Document Released: 11/21/2006 Document Revised: 10/19/2011 Document Reviewed: 09/21/2011 [facility] Patient Information 2014 [place].   Postpartum Depression and Baby Blues  The postpartum period begins right after the birth of a baby. During this time, there is often a great amount of joy and excitement. It is also a time of considerable changes in the life of the parent(s). Regardless of how many times a mother gives birth, each child brings new challenges and dynamics to the family. It is not unusual to have feelings of excitement accompanied by confusing shifts in moods, emotions, and thoughts. All mothers are at risk of developing postpartum depression or the "baby blues." These mood changes can occur right after giving birth, or they may occur many months after giving birth. The baby blues or postpartum depression can be mild or severe. Additionally, postpartum depression can resolve rather quickly, or it can be a long-term condition. CAUSES Elevated hormones and their rapid decline  are thought to be a main cause of postpartum depression and the baby blues. There are a number of hormones that radically  change during and after pregnancy. Estrogen and progesterone usually decrease immediately after delivering your baby. The level of thyroid hormone and various cortisol steroids also rapidly drop. Other factors that play a major role in these changes include major life events and genetics.  RISK FACTORS If you have any of the following risks for the baby blues or postpartum depression, know what symptoms to watch out for during the postpartum period. Risk factors that may increase the likelihood of getting the baby blues or postpartum depression include: 1. Havinga personal or family history of depression. 2. Having depression while being pregnant. 3. Having premenstrual or oral contraceptive-associated mood issues. 4. Having exceptional life stress. 5. Having marital conflict. 6. Lacking a social support network. 7. Having a baby with special needs. 8. Having health problems such as diabetes. SYMPTOMS Baby blues symptoms include:  Brief fluctuations in mood, such as going from extreme happiness to sadness.  Decreased concentration.  Difficulty sleeping.  Crying spells, tearfulness.  Irritability.  Anxiety. Postpartum depression symptoms typically begin within the first month after giving birth. These symptoms include:  Difficulty sleeping or excessive sleepiness.  Marked weight loss.  Agitation.  Feelings of worthlessness.  Lack of interest in activity or food. Postpartum psychosis is a very concerning condition and can be dangerous. Fortunately, it is rare. Displaying any of the following symptoms is cause for immediate medical attention. Postpartum psychosis symptoms include:  Hallucinations and delusions.  Bizarre or disorganized behavior.  Confusion or disorientation. DIAGNOSIS  A diagnosis is made by an evaluation of your symptoms. There are no  medical or lab tests that lead to a diagnosis, but there are various questionnaires that a caregiver may use to identify those with the baby blues, postpartum depression, or psychosis. Often times, a screening tool called the [place] Postnatal Depression Scale is used to diagnose depression in the postpartum period.  TREATMENT The baby blues usually goes away on its own in 1 to 2 weeks. Social support is often all that is needed. You should be encouraged to get adequate sleep and rest. Occasionally, you may be given medicines to help you sleep.  Postpartum depression requires treatment as it can last several months or longer if it is not treated. Treatment may include individual or group therapy, medicine, or both to address any social, physiological, and psychological factors that may play a role in the depression. Regular exercise, a healthy diet, rest, and social support may also be strongly recommended.  Postpartum psychosis is more serious and needs treatment right away. Hospitalization is often needed. HOME CARE INSTRUCTIONS  Get as much rest as you can. Nap when the baby sleeps.  Exercise regularly. Some women find yoga and walking to be beneficial.  Eat a balanced and nourishing diet.  Do little things that you enjoy. Have a cup of tea, take a bubble bath, read your favorite magazine, or listen to your favorite music.  Avoid alcohol.  Ask for help with household chores, cooking, grocery shopping, or running errands as needed. Do not try to do everything.  Talk to people close to you about how you are feeling. Get support from your partner, family members, friends, or other new moms.  Try to stay positive in how you think. Think about the things you are grateful for.  Do not spend a lot of time alone.  Only take medicine as directed by your caregiver.  Keep all your postpartum appointments.  Let your caregiver know if you have any concerns. [place]  IF: You are having  a reaction or problems with your medicine. SEEK IMMEDIATE MEDICAL CARE IF:  You have suicidal feelings.  You feel you may harm the baby or someone else. Document Released: 10/29/2003 Document Revised: 04/18/2011 Document Reviewed: 11/30/2010 [facility] Patient Information 2014 [place], [place].     Breastfeeding Deciding to breastfeed is one of the best choices you can make for you and your baby. A change in hormones during pregnancy causes your breast tissue to grow and increases the number and size of your milk ducts. These hormones also allow proteins, sugars, and fats from your blood supply to make breast milk in your milk-producing glands. Hormones prevent breast milk from being released before your baby is born as well as prompt milk flow after birth. Once breastfeeding has begun, thoughts of your baby, as well as his or her sucking or crying, can stimulate the release of milk from your milk-producing glands.  BENEFITS OF BREASTFEEDING For Your Baby  Your first milk (colostrum) helps your baby's digestive system function better.   There are antibodies in your milk that help your baby fight off infections.   Your baby has a lower incidence of asthma, allergies, and sudden infant death syndrome.   The nutrients in breast milk are better for your baby than infant formulas and are designed uniquely for your baby's needs.   Breast milk improves your baby's brain development.   Your baby is less likely to develop other conditions, such as childhood obesity, asthma, or type 2 diabetes mellitus.  For You   Breastfeeding helps to create a very special bond between you and your baby.   Breastfeeding is convenient. Breast milk is always available at the correct temperature and costs nothing.   Breastfeeding helps to burn calories and helps you lose the weight gained during pregnancy.   Breastfeeding makes your uterus contract to its prepregnancy size faster and slows bleeding  (lochia) after you give birth.   Breastfeeding helps to lower your risk of developing type 2 diabetes mellitus, osteoporosis, and breast or ovarian cancer later in life. SIGNS THAT YOUR BABY IS HUNGRY Early Signs of Hunger  Increased alertness or activity.  Stretching.  Movement of the head from side to side.  Movement of the head and opening of the mouth when the corner of the mouth or cheek is stroked (rooting).  Increased sucking sounds, smacking lips, cooing, sighing, or squeaking.  Hand-to-mouth movements.  Increased sucking of fingers or hands. Late Signs of Hunger  Fussing.  Intermittent crying. Extreme Signs of Hunger Signs of extreme hunger will require calming and consoling before your baby will be able to breastfeed successfully. Do not wait for the following signs of extreme hunger to occur before you initiate breastfeeding:   Restlessness.  A loud, strong cry.   Screaming.   BREASTFEEDING BASICS Breastfeeding Initiation  Find a comfortable place to sit or lie down, with your neck and back well supported.  Place a pillow or rolled up blanket under your baby to bring him or her to the level of your breast (if you are seated). Nursing pillows are specially designed to help support your arms and your baby while you breastfeed.  Make sure that your baby's abdomen is facing your abdomen.   Gently massage your breast. With your fingertips, massage from your chest wall toward your nipple in a circular motion. This encourages milk flow. You may need to continue this action during the feeding if your milk flows slowly.  Support  your breast with 4 fingers underneath and your thumb above your nipple. Make sure your fingers are well away from your nipple and your baby's mouth.   Stroke your baby's lips gently with your finger or nipple.   When your baby's mouth is open wide enough, quickly bring your baby to your breast, placing your entire nipple and as much of  the colored area around your nipple (areola) as possible into your baby's mouth.   More areola should be visible above your baby's upper lip than below the lower lip.   Your baby's tongue should be between his or her lower gum and your breast.   Ensure that your baby's mouth is correctly positioned around your nipple (latched). Your baby's lips should create a seal on your breast and be turned out (everted).  It is common for your baby to suck about 2-3 minutes in order to start the flow of breast milk. Latching Teaching your baby how to latch on to your breast properly is very important. An improper latch can cause nipple pain and decreased milk supply for you and poor weight gain in your baby. Also, if your baby is not latched onto your nipple properly, he or she may swallow some air during feeding. This can make your baby fussy. Burping your baby when you switch breasts during the feeding can help to get rid of the air. However, teaching your baby to latch on properly is still the best way to prevent fussiness from swallowing air while breastfeeding. Signs that your baby has successfully latched on to your nipple:    Silent tugging or silent sucking, without causing you pain.   Swallowing heard between every 3-4 sucks.    Muscle movement above and in front of his or her ears while sucking.  Signs that your baby has not successfully latched on to nipple:   Sucking sounds or smacking sounds from your baby while breastfeeding.  Nipple pain. If you think your baby has not latched on correctly, slip your finger into the corner of your baby's mouth to break the suction and place it between your baby's gums. Attempt breastfeeding initiation again. Signs of Successful Breastfeeding Signs from your baby:   A gradual decrease in the number of sucks or complete cessation of sucking.   Falling asleep.   Relaxation of his or her body.   Retention of a small amount of milk in his or  her mouth.   Letting go of your breast by himself or herself. Signs from you:  Breasts that have increased in firmness, weight, and size 1-3 hours after feeding.   Breasts that are softer immediately after breastfeeding.  Increased milk volume, as well as a change in milk consistency and color by the fifth day of breastfeeding.   Nipples that are not sore, cracked, or bleeding. Signs That Your Randel Books is Getting Enough Milk  Wetting at least 3 diapers in a 24-hour period. The urine should be clear and pale yellow by age 22 days.  At least 3 stools in a 24-hour period by age 22 days. The stool should be soft and yellow.  At least 3 stools in a 24-hour period by age 73 days. The stool should be seedy and yellow.  No loss of weight greater than 10% of birth weight during the first 93 days of age.  Average weight gain of 4-7 ounces (113-198 g) per week after age 3 days.  Consistent daily weight gain by age 2 days, without weight  loss after the age of 2 weeks. After a feeding, your baby may spit up a small amount. This is common. BREASTFEEDING FREQUENCY AND DURATION Frequent feeding will help you make more milk and can prevent sore nipples and breast engorgement. Breastfeed when you feel the need to reduce the fullness of your breasts or when your baby shows signs of hunger. This is called "breastfeeding on demand." Avoid introducing a pacifier to your baby while you are working to establish breastfeeding (the first 4-6 weeks after your baby is born). After this time you may choose to use a pacifier. Research has shown that pacifier use during the first year of a baby's life decreases the risk of sudden infant death syndrome (SIDS). Allow your baby to feed on each breast as long as he or she wants. Breastfeed until your baby is finished feeding. When your baby unlatches or falls asleep while feeding from the first breast, offer the second breast. Because newborns are often sleepy in the first few  weeks of life, you may need to awaken your baby to get him or her to feed. Breastfeeding times will vary from baby to baby. However, the following rules can serve as a guide to help you ensure that your baby is properly fed:  Newborns (babies 14 weeks of age or younger) may breastfeed every 1-3 hours.  Newborns should not go longer than 3 hours during the day or 5 hours during the night without breastfeeding.  You should breastfeed your baby a minimum of 8 times in a 24-hour period until you begin to introduce solid foods to your baby at around 89 months of age. BREAST MILK PUMPING Pumping and storing breast milk allows you to ensure that your baby is exclusively fed your breast milk, even at times when you are unable to breastfeed. This is especially important if you are going back to work while you are still breastfeeding or when you are not able to be present during feedings. Your lactation consultant can give you guidelines on how long it is safe to store breast milk.  A breast pump is a machine that allows you to pump milk from your breast into a sterile bottle. The pumped breast milk can then be stored in a refrigerator or freezer. Some breast pumps are operated by hand, while others use electricity. Ask your lactation consultant which type will work best for you. Breast pumps can be purchased, but some hospitals and breastfeeding support groups lease breast pumps on a monthly basis. A lactation consultant can teach you how to hand express breast milk, if you prefer not to use a pump.  CARING FOR YOUR BREASTS WHILE YOU BREASTFEED Nipples can become dry, cracked, and sore while breastfeeding. The following recommendations can help keep your breasts moisturized and healthy:  Avoid using soap on your nipples.   Wear a supportive bra. Although not required, special nursing bras and tank tops are designed to allow access to your breasts for breastfeeding without taking off your entire bra or top.  Avoid wearing underwire-style bras or extremely tight bras.  Air dry your nipples for 3-31minutes after each feeding.   Use only cotton bra pads to absorb leaked breast milk. Leaking of breast milk between feedings is normal.   Use lanolin on your nipples after breastfeeding. Lanolin helps to maintain your skin's normal moisture barrier. If you use pure lanolin, you do not need to wash it off before feeding your baby again. Pure lanolin is not toxic to your  baby. Dennis Bast may also hand express a few drops of breast milk and gently massage that milk into your nipples and allow the milk to air dry. In the first few weeks after giving birth, some women experience extremely full breasts (engorgement). Engorgement can make your breasts feel heavy, warm, and tender to the touch. Engorgement peaks within 3-5 days after you give birth. The following recommendations can help ease engorgement:  Completely empty your breasts while breastfeeding or pumping. You may want to start by applying warm, moist heat (in the shower or with warm water-soaked hand towels) just before feeding or pumping. This increases circulation and helps the milk flow. If your baby does not completely empty your breasts while breastfeeding, pump any extra milk after he or she is finished.  Wear a snug bra (nursing or regular) or tank top for 1-2 days to signal your body to slightly decrease milk production.  Apply ice packs to your breasts, unless this is too uncomfortable for you.  Make sure that your baby is latched on and positioned properly while breastfeeding. If engorgement persists after 48 hours of following these recommendations, contact your health care provider or a Science writer. OVERALL HEALTH CARE RECOMMENDATIONS WHILE BREASTFEEDING  Eat healthy foods. Alternate between meals and snacks, eating 3 of each per day. Because what you eat affects your breast milk, some of the foods may make your baby more irritable than  usual. Avoid eating these foods if you are sure that they are negatively affecting your baby.  Drink milk, fruit juice, and water to satisfy your thirst (about 10 glasses a day).   Rest often, relax, and continue to take your prenatal vitamins to prevent fatigue, stress, and anemia.  Continue breast self-awareness checks.  Avoid chewing and smoking tobacco.  Avoid alcohol and drug use. Some medicines that may be harmful to your baby can pass through breast milk. It is important to ask your health care provider before taking any medicine, including all over-the-counter and prescription medicine as well as vitamin and herbal supplements. It is possible to become pregnant while breastfeeding. If birth control is desired, ask your health care provider about options that will be safe for your baby. SEEK MEDICAL CARE IF:   You feel like you want to stop breastfeeding or have become frustrated with breastfeeding.  You have painful breasts or nipples.  Your nipples are cracked or bleeding.  Your breasts are red, tender, or warm.  You have a swollen area on either breast.  You have a fever or chills.  You have nausea or vomiting.  You have drainage other than breast milk from your nipples.  Your breasts do not become full before feedings by the fifth day after you give birth.  You feel sad and depressed.  Your baby is too sleepy to eat well.  Your baby is having trouble sleeping.   Your baby is wetting less than 3 diapers in a 24-hour period.  Your baby has less than 3 stools in a 24-hour period.  Your baby's skin or the white part of his or her eyes becomes yellow.   Your baby is not gaining weight by 66 days of age. SEEK IMMEDIATE MEDICAL CARE IF:   Your baby is overly tired (lethargic) and does not want to wake up and feed.  Your baby develops an unexplained fever. Document Released: 01/24/2005 Document Revised: 01/29/2013 Document Reviewed: 07/18/2012 [facility]  Patient Information 2015 [place], [place]. This information is not intended to replace advice given  to you by your health care provider. Make sure you discuss any questions you have with your health care provider.  

## 2015-01-29 NOTE — Progress Notes (Signed)
Anne Marks   Subjective: Post Partum Day 1 Vaginal delivery, 1st degree laceration and a periurethral  Patient up ad lib, denies syncope or dizziness. Reports consuming regular diet without issues and denies N/V No issues with urination and reports bleeding is appropriate  Feeding:  bottle Contraceptive plan:   none  Objective: Temp:  [97.8 F (36.6 C)] 97.8 F (36.6 C) (12/22 0603) Pulse Rate:  [68-95] 68 (12/22 0603) Resp:  [18-20] 18 (12/22 0603) BP: (114-120)/(46-71) 114/46 mmHg (12/22 0603)  Physical Exam:  General: alert and cooperative Ext: WNL, no significant  edema. No evidence of DVT seen on physical exam. Breast: Soft filling Lungs: CTAB Heart RRR without murmur  Abdomen:  Soft, fundus firm, lochia scant, + bowel sounds, non distended, non tender Lochia: appropriate Uterine Fundus: firm Laceration: healing well    Recent Labs  01/28/15 0220 01/28/15 1645  HGB 11.2* 10.1*  HCT 33.9* 31.7*    Assessment S/P Vaginal Delivery-Day 1 Stable  Normal Involution Bottlefeeding   Plan: Continue current care Plan for discharge tomorrow Lactation support CSW in to have adoption papers   Dartanyon Frankowski, CNM, MSN 01/29/2015, 9:32 AM

## 2015-01-29 NOTE — Plan of Care (Signed)
Problem: Role Relationship: Goal: Ability to demonstrate positive interaction with newborn will improve Outcome: Completed/Met Date Met:  01/29/15 Baby Is Up For Adoption

## 2015-01-29 NOTE — Progress Notes (Signed)
CSW followed up with patient at 10:30am in order to continue to provide support, assist her process her feelings secondary to the adoption, and to determine intentions to sign adoption paperwork.    Patient's sister was also present in the room, and patient provided consent for CSW to continue with visit.  Patient's sister discussed at length her desire to support the patient in the decision, but expressed concern that patient will "regret it" later.  Patient was noted to become appropriately tearful as she discussed how she felt about the adoption.  Patient continued to decline desire to sign HIPPA release forms.   CSW followed up with patient at 1:45pm, representative from adoption agency present. Patient provided consent for CSW to speak with Development worker, international aid.  Patient has now signed HIPPA consent forms, and is discussing intentions to sign adoption paperwork.  CSW made copies of consent forms, and hospital staff are now able to discuss all information with the adoption agency and the adoptive parents.   CSW spoke with adoption social worker, Charolotte Eke (who is located in [place]) to begin to coordinate adoption and infant's discharge.  Adoptive parents live in [place], but will remain in [place] while adoption paperwork is finalized. CSW informed S.Grandville Silos of need for adoptive parents to locate and arrange for pediatrician follow up until they are able to return to [place].   Prior to patient being able to sign relinquishment of parental rights, agency requires that patient have a doctor evaluate and determine that she is competent and capable of signing adoption paperwork.  CSW consulted with Dr. Raphael Gibney, attending obstetrician, who ordered a psychiatric consult.  Patient is aware that this must occur prior to her signing paperwork, and is agreeable.  CSW placed "physical statement" on the front of patient's chart. This statement must be completed by psychiatrist. CSW informed RN and patient of  location of paper.    Per adoption agency, they will return to the hospital when infant is medically ready for discharge in order to have the patient sign relinquishment of parental rights/adoption paperwork.  CSW will continue to closely follow in order to assist with coordination of adoption.

## 2015-01-30 DIAGNOSIS — F4329 Adjustment disorder with other symptoms: Secondary | ICD-10-CM

## 2015-01-30 MED ORDER — FERROUS SULFATE 325 (65 FE) MG PO TBEC: 325.0000 mg | DELAYED_RELEASE_TABLET | Freq: Two times a day (BID) | ORAL | Status: AC

## 2015-01-30 MED ORDER — IBUPROFEN 600 MG PO TABS: 600.0000 mg | ORAL_TABLET | Freq: Four times a day (QID) | ORAL | Status: AC
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
## 2015-01-30 NOTE — Lactation Note (Signed)
Lactation Consultation Note  Patient Name: Anne Marks [id number] Date: 01/30/2015    I spoke w/Ms. Mcquire about managing her breasts after lactogenesis II (supportive bra; continuous use of cold cabbage leaves [NKDA]; express if breasts become too full/uncomfortable; & sage tea [follow instructions on box]).  Mom also taught hand expression in case she needs to relieve undue pressure.   Matthias Hughs [facility] 01/30/2015, 11:42 AM

## 2015-01-30 NOTE — Progress Notes (Signed)
Per requirement of the adoption agency, biological mother required psychiatric consult in order to assess that she is able capable and competent to sign adoption paperwork.   CSW present when psychiatric consult occurred, attending psychiatrist completed necessary paperwork.   CSW informed that infant will be transferred to NICU.   CSW spoke with adoption agency to provide update on plan of care.  Per adoption agency, biological mother will not relinquish parental rights until infant is medically ready for discharge.  CSW to be in contact with adoption agency on 12/24 once CSW receives medical update on infant and to continue to arrange for relinquishment paperwork to be completed.    CSW followed up with biological mother and the adoptive mother.  The biological mother reported that she is coping well with the psychiatric consult and the news of the infant's transfer to the NICU.  The biological mother reported that she will remain in contact with CSW and the adoptive agency in order to complete relinquishment paperwork prior to infant's discharge.  Since the biological has not yet signed relinquishment of parental rights, the biological birth continues to have parental rights and will make all medical decisions for the infant.

## 2016-12-07 IMAGING — US US MFM OB LIMITED
1 series · 15 of 26 positions shown · non-contrast
Comparison: none

OBSTETRICS REPORT
(Signed Final 11/21/2014 [DATE])

Service(s) Provided
US MFM OB LIMITED                                     76815.01
Indications
28 weeks gestation of pregnancy
IUD with pregnancy
Insufficient Prenatal Care
Uncertain LMP,  Establish Gestational Age             Z36
Fetal Evaluation
Num Of Fetuses:    1
Fetal Heart Rate:  140                          bpm
Cardiac Activity:  Observed
Presentation:      Cephalic
Placenta:          Posterior, above cervical
os
P. Cord            Visualized, central
Insertion:
Amniotic Fluid
AFI FV:      Subjectively within normal limits
AFI Sum:     13.45   cm                     Larg Pckt:    3.59  cm
RUQ:   3.47    cm   RLQ:    2.97   cm    LUQ:   3.59    cm   LLQ:    3.42   cm
Biometry
BPD:     71.5  mm     G. Age:  28w 5d
FL/BPD:
FL:      54.1  mm     G. Age:  28w 4d
Cervix Uterus Adnexa
Cervix:       Normal appearance by transabdominal scan.
Uterus:       No abnormality visualized.
Cul De Sac:   No free fluid seen.
Left Ovary:    Within normal limits.
Right Ovary:   Within normal limits.
Adnexa:     No abnormality visualized.
Comment:    No IUD visualized during exam.
Impression
INDICATION: 23 yr old G1P0 with unsure LMP and possible
retained IUD for fetal ultrasound. Remote read.

[Series 1: us mfm ob limited · 26 acquisitions, 15 frames shown]
[im 1/26]
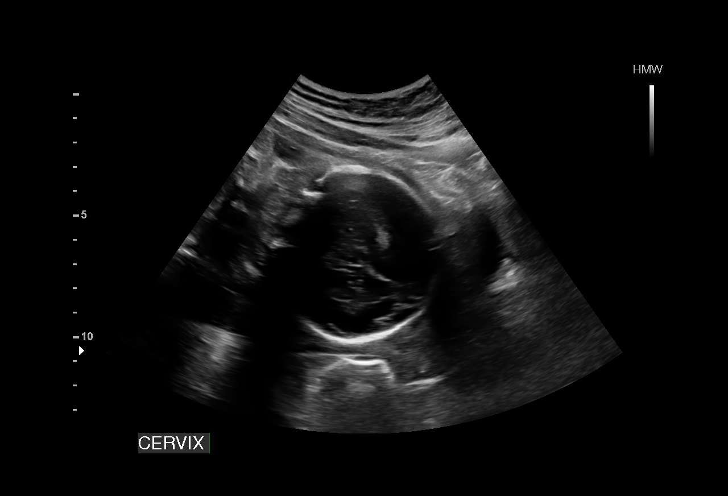
[im 3/26]
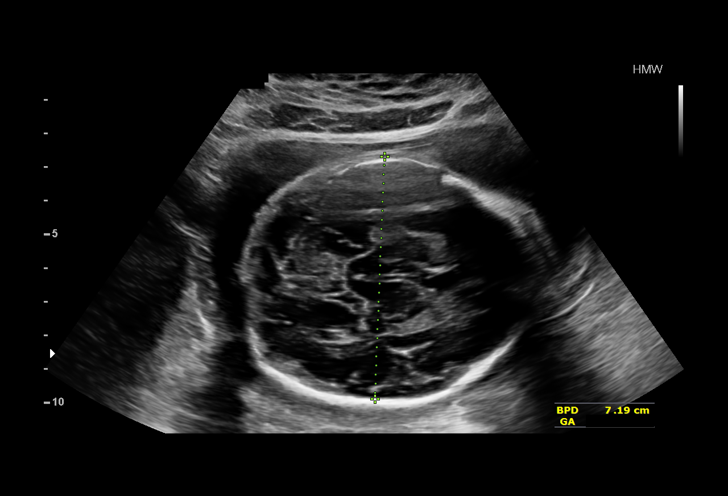
[im 5/26]
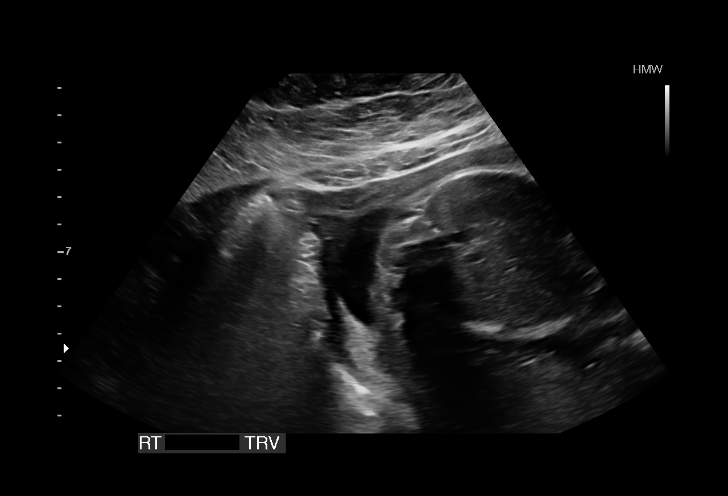
[im 7/26]
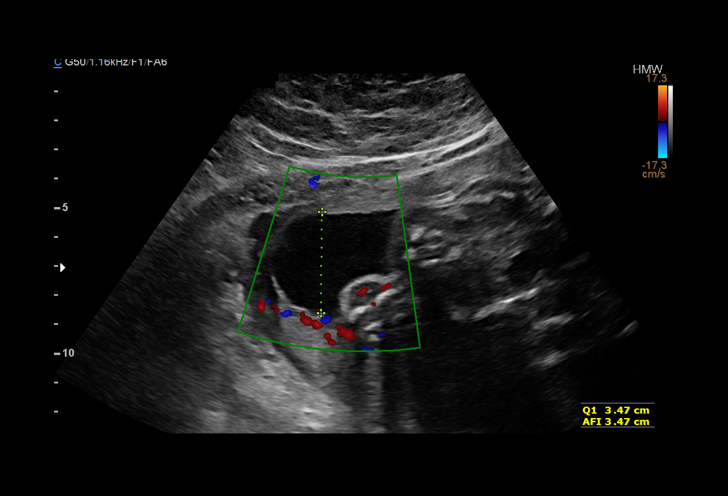
[im 8/26]
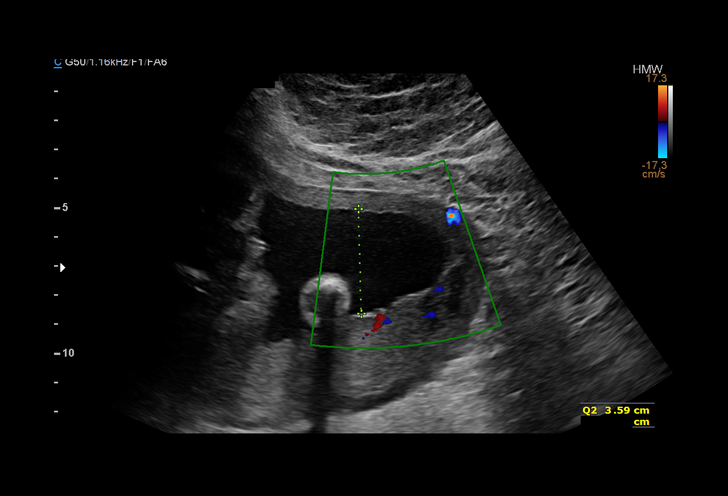
[im 10/26]
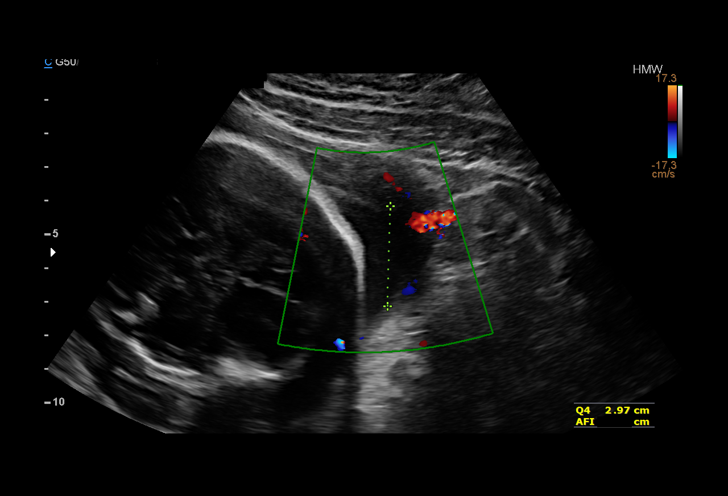
[im 12/26]
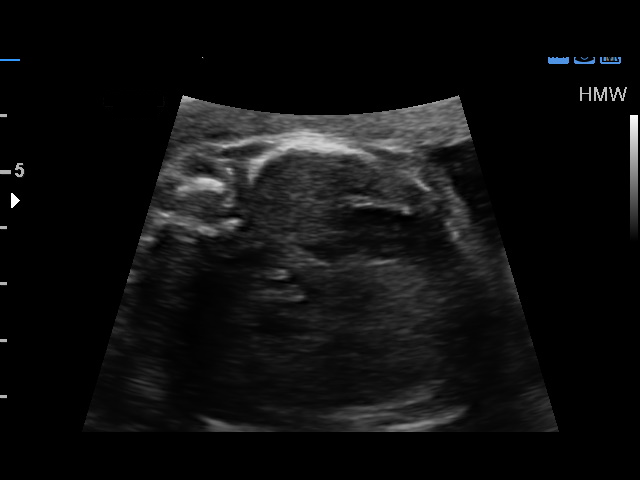
[im 14/26]
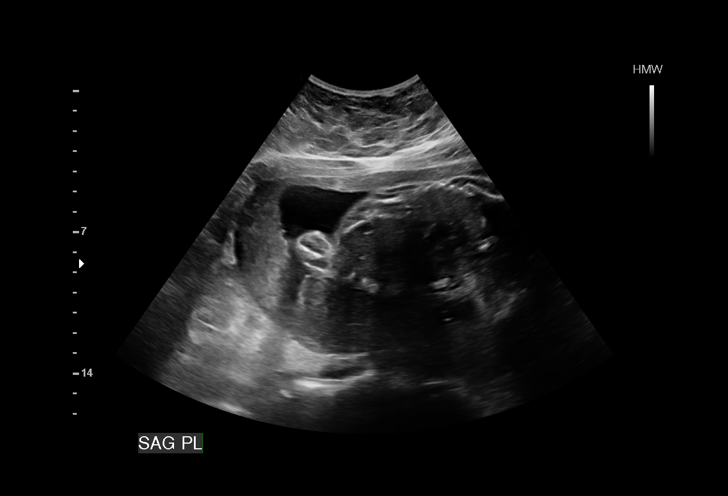
[im 15/26]
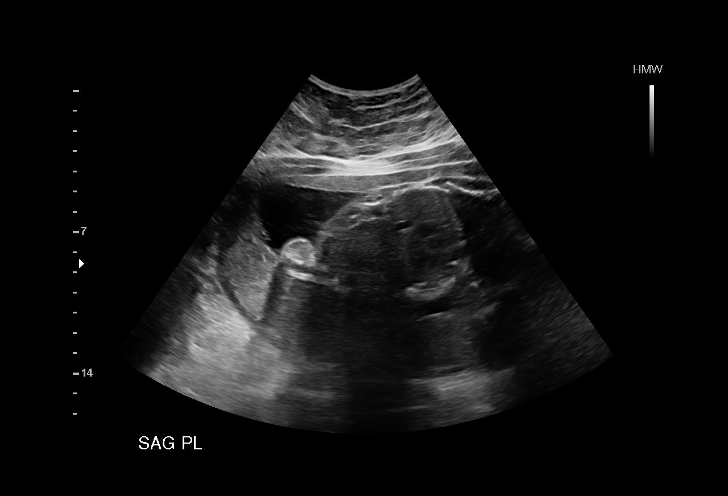
[im 17/26]
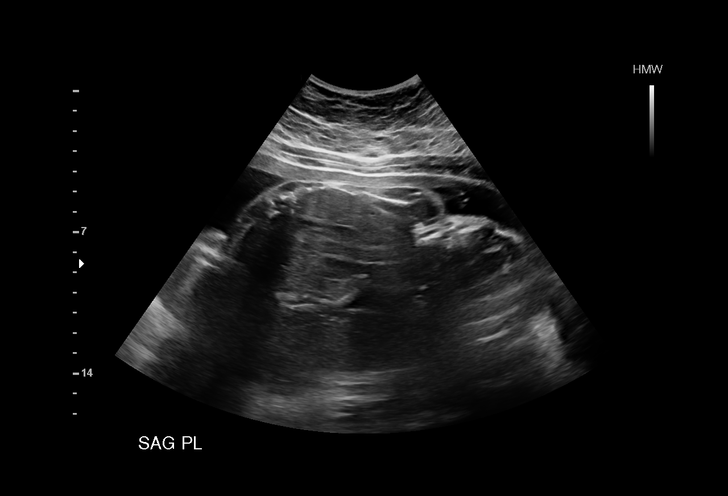
[im 19/26]
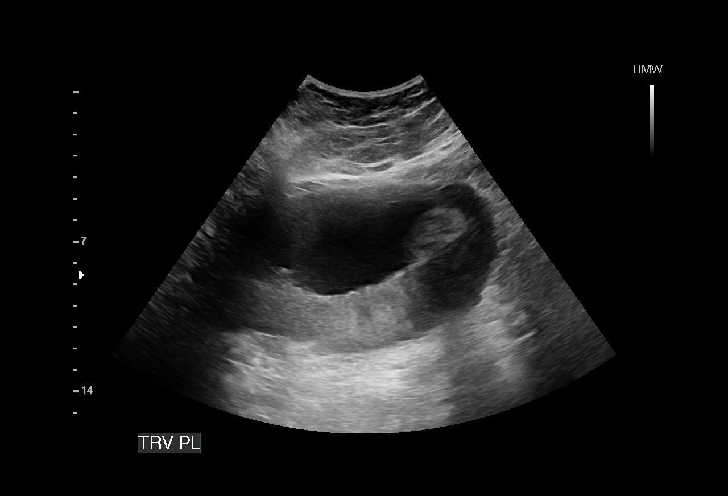
[im 20/26]
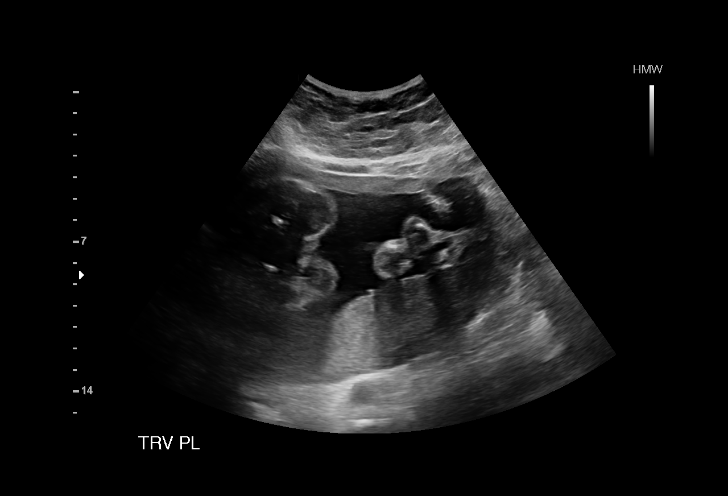
[im 22/26]
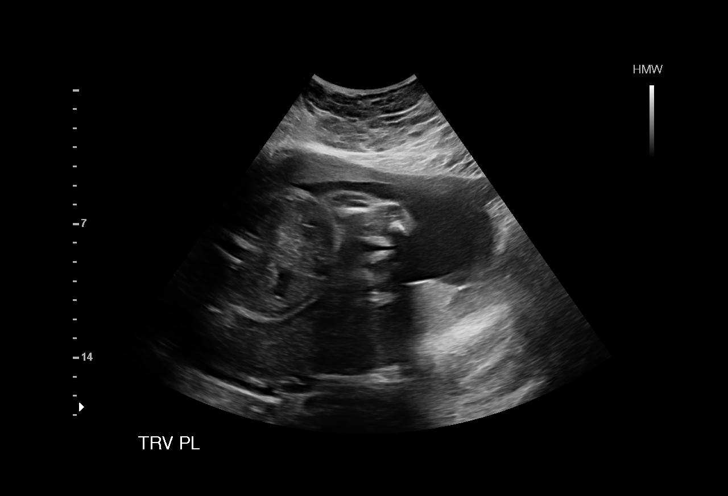
[im 24/26]
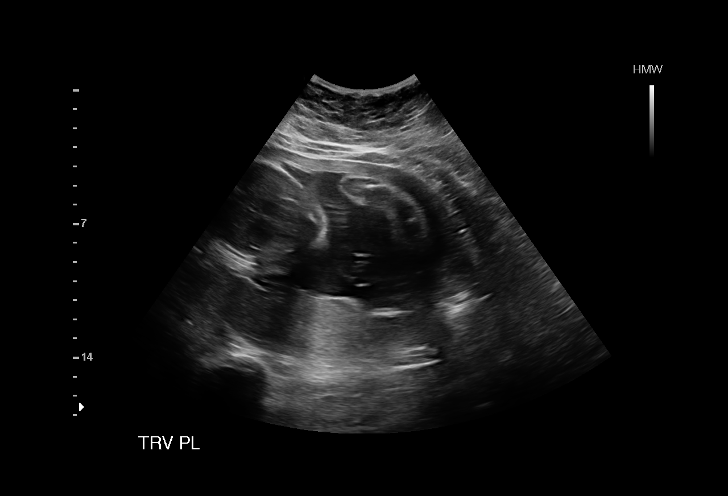
[im 26/26]
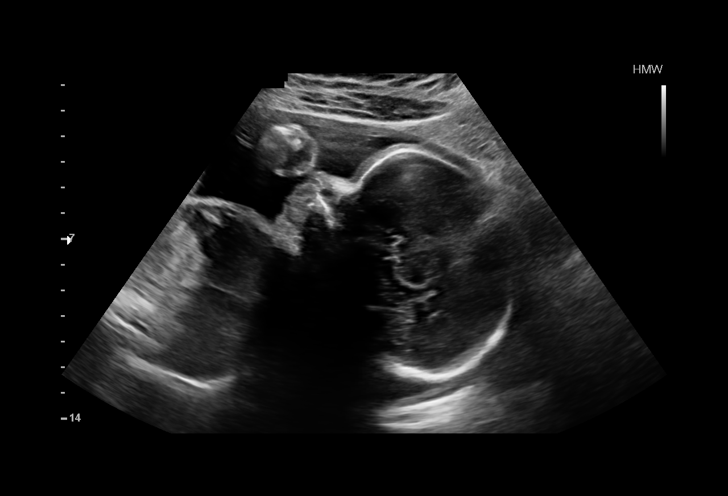

[15 of 26 positions shown; findings below may reference images not displayed]

FINDINGS: 1. Single intrauterine pregnancy.
2. Head and femur measurements consistent with 86w5d.
3. Posterior placenta without evidence of previa.
4. Normal amniotic fluid index.
5. Normal transabdominal cervical length.
6. No IUD is visualized.
Recommendations

1. Recommend full biometry to determine EDD.
2. No evidence of previa.
3. No IUD is visualized.
4. Recommend fetal anatomic survey.

questions or concerns.

## 2017-01-18 IMAGING — US US MFM OB DETAIL+14 WK
1 series · 14 of 28 positions shown · non-contrast
Comparison: none

[Series 1: us mfm ob detail+14 wk · 75 acquisitions, 14 frames shown]
[im 3/75]
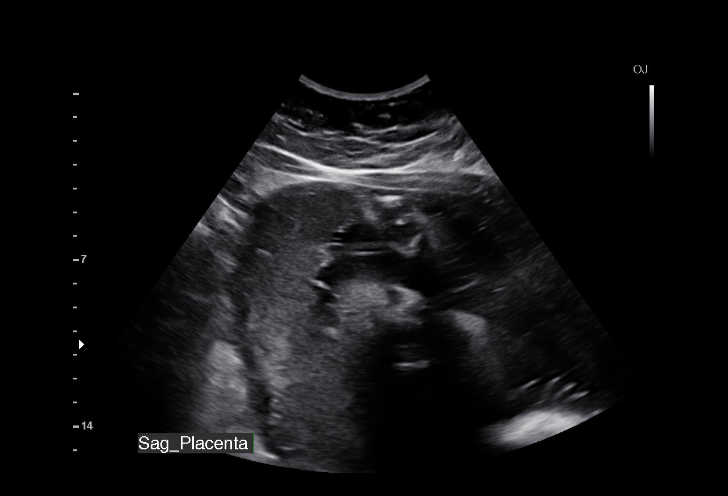
[im 9/75]
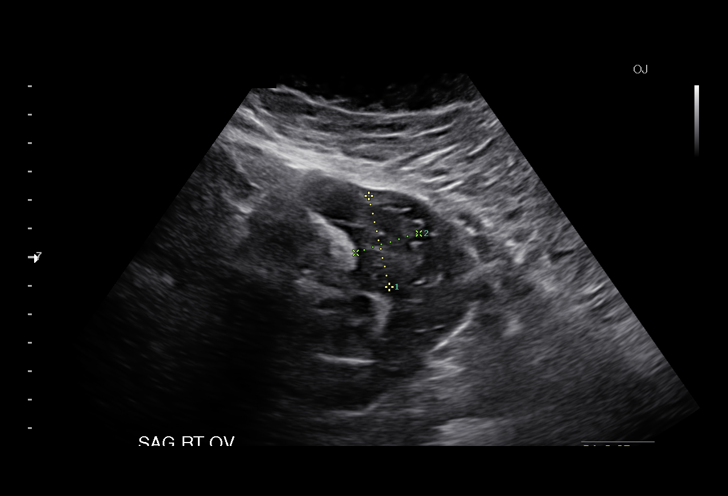
[im 14/75]
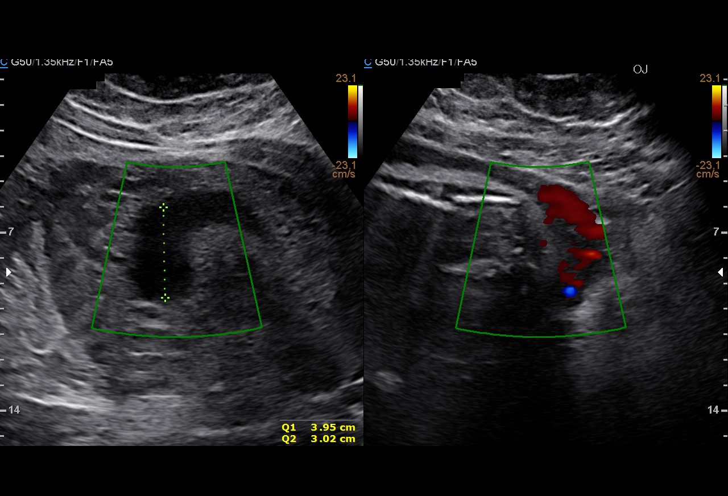
[im 20/75]
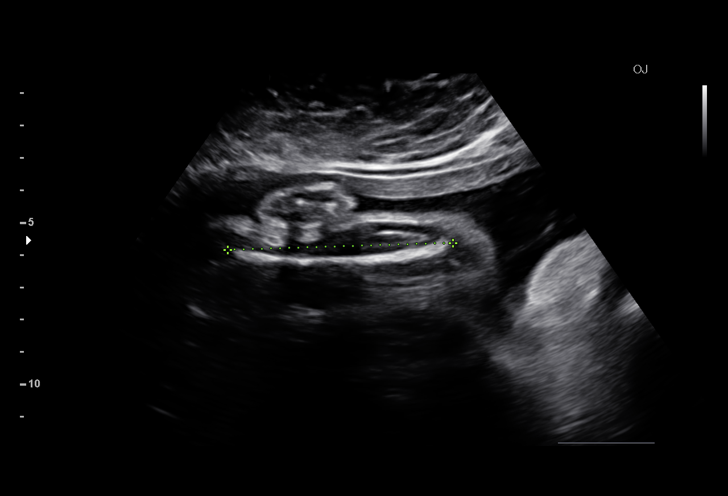
[im 25/75]
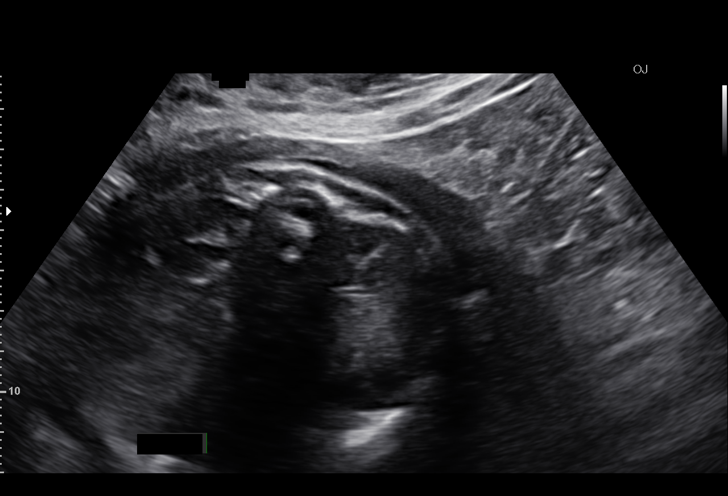
[im 31/75]
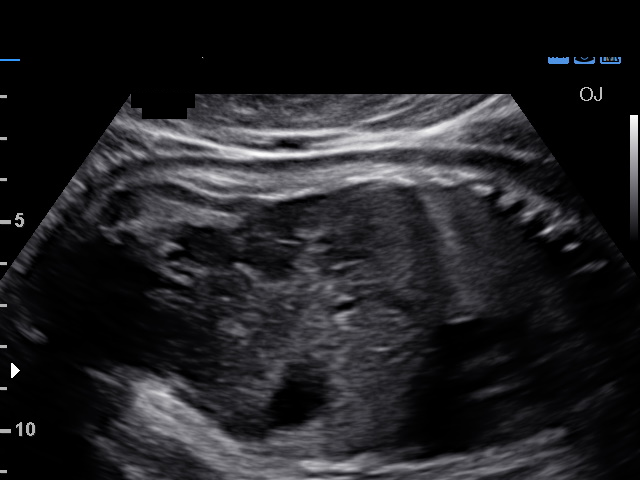
[im 36/75]
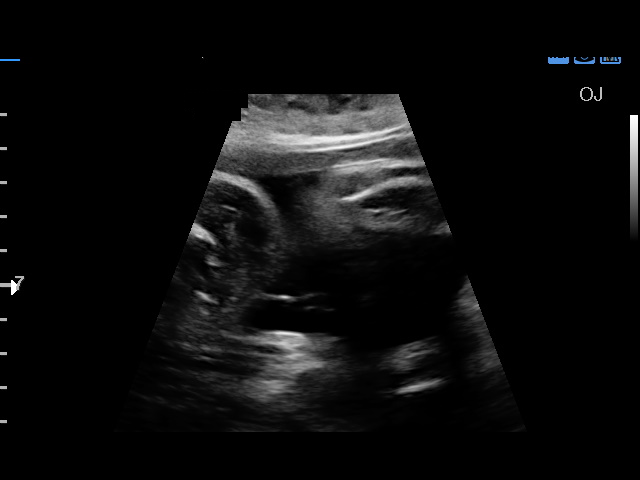
[im 42/75]
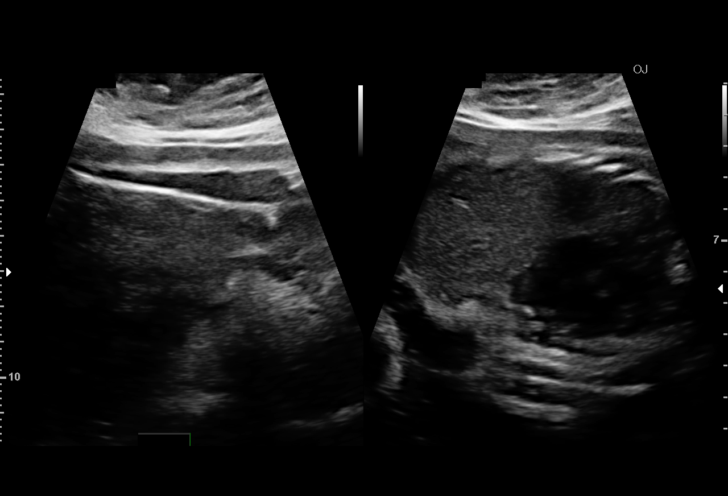
[im 47/75]
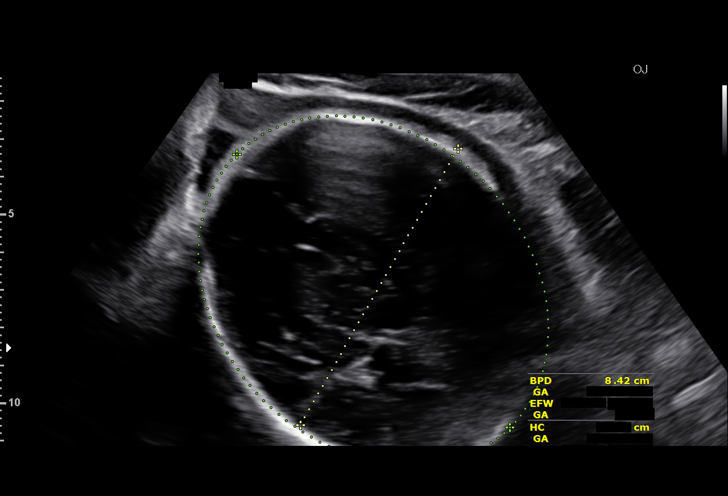
[im 53/75]
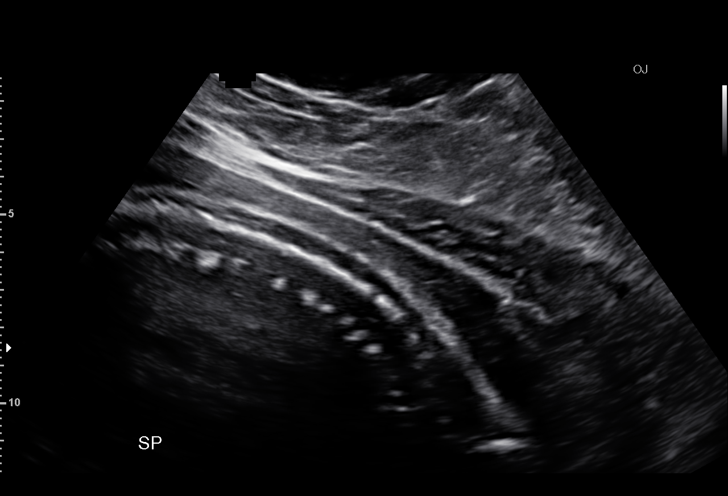
[im 58/75]
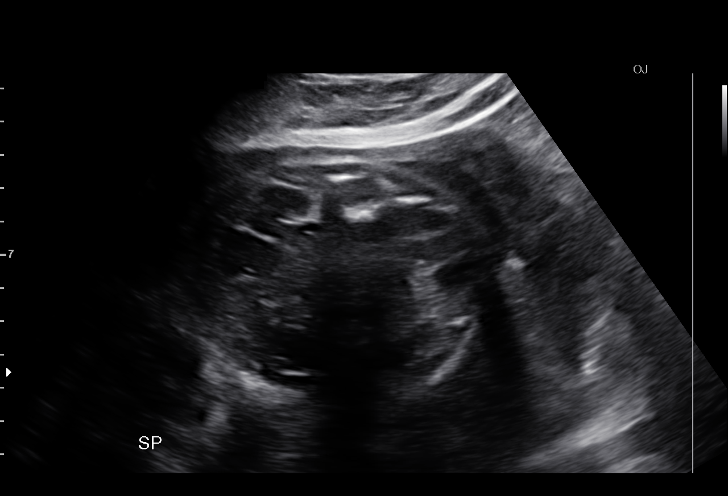
[im 64/75]
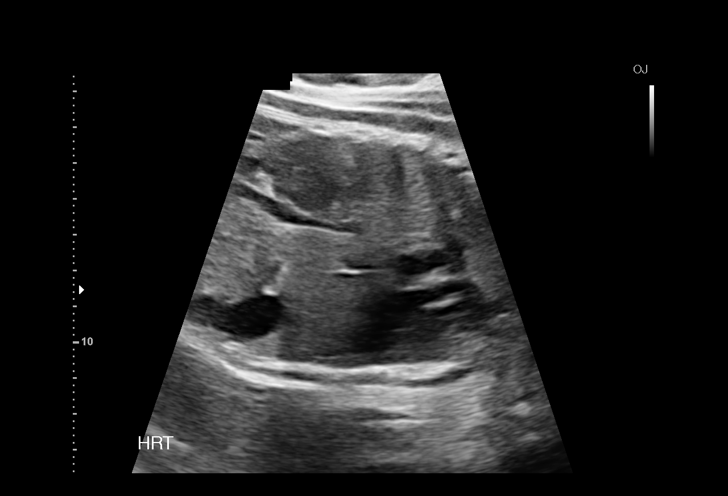
[im 69/75]
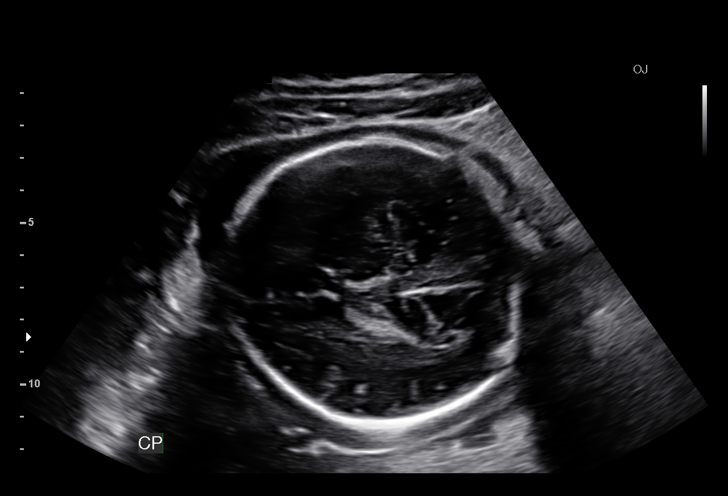
[im 75/75]
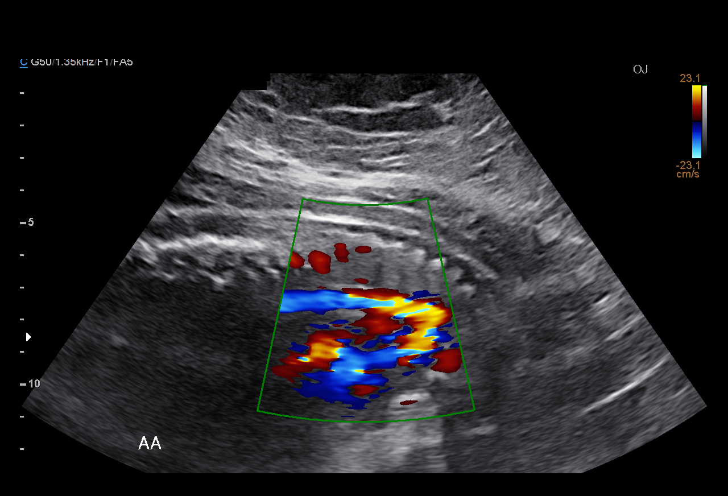

[14 of 28 positions shown; findings below may reference images not displayed]

OBSTETRICS REPORT
(Signed Final 01/02/2015 [DATE])

Date:

By:
Service(s) Provided

Indications

Late prenatal care, second trimester
Uncertain LMP,  Establish Gestational Age              Z36
IUD with pregnancy
Obesity complicating pregnancy, third trimester
Fetal Evaluation

Num Of             1
Fetuses:
Fetal Heart        147                          bpm
Rate:
Cardiac Activity:  Observed
Presentation:      Cephalic
Placenta:          Posterior Fundal, above
cervical os
P. Cord            Not well visualized
Insertion:

Amniotic Fluid
AFI FV:      Subjectively within normal limits
AFI Sum:     10.51    cm      24  %Tile     Larg Pckt:    3.95   cm
RUQ:   3.95    cm    LUQ:   3.02    cm   LLQ:    3.54    cm
Biometry

BPD:     84.8   m    G. Age:   34w 1d                 CI:        79.93   70 - 86
m
FL/HC:      22.6   20.1 -
22.3
HC:     299.7   m    G. Age:   33w 2d       < 3  %    HC/AC:      1.02   0.93 -
m
AC:     293.3   m    G. Age:   33w 2d        19  %    FL/BPD      80.0   71 - 87
m                                     :
FL:      67.8   m    G. Age:   34w 6d        46  %    FL/AC:      23.1   20 - 24
m
Est.        5509   gm    5 lb 1 oz      43   %
FW:
Gestational Age

U/S Today:     33w 6d                                         EDD:   02/14/15
Best:          34w 5d    Det. By:   Previous Ultrasound       EDD:   02/08/15
Anatomy

Cranium:          Appears normal         Aortic Arch:       Appears normal
Fetal Cavum:      Not well visualized    Ductal Arch:       Not well visualized
Ventricles:       Appears normal         Diaphragm:         Appears normal
Choroid Plexus:   Not well visualized    Stomach:           Appears normal,
left sided
Cerebellum:       Not well visualized    Abdomen:           Appears normal
Posterior         Appears normal         Abdominal          Not well visualized
Fossa:                                   Wall:
Nuchal Fold:      Not applicable (>20    Cord Vessels:      Appears normal (3
wks GA)                                   vessel cord)
Face:             Orbits nl; profile     Kidneys:           Appear normal
not well visualized
Lips:             Appears normal         Bladder:           Appears normal
Heart:            Not well visualized    Spine:             Appears normal
RVOT:             Previously seen        Lower              Visualized
Extremities:
LVOT:             Appears normal         Upper              Visualized
Extremities:

Other:   Fetus appears to be a female. Technically difficult due to maternal
habitus, fetal position and advanced GA.
Cervix Uterus Adnexa

Cervix:       Not visualized (advanced GA >68wks)

Left Ovary:    Within normal limits.
Right Ovary:   Within normal limits.
Impression

Single IUP at 34w 5d
Patient seen due to concerns of lagging growth
Dating based on 3rd trimester ultrasound
Limited views of the fetal anatomy obtained due to late
gestational age and fetal position
On ultrasound today, the estimated fetal weight is at the
43rd %tile.  The AC measures at the 19th %tile.
Posterior fundal placenta
Normal amniotic fluid volume
Recommendations

Follow-up ultrasounds as clinically indicated.
questions or concerns.

## 2017-07-18 ENCOUNTER — Encounter (HOSPITAL_COMMUNITY): Payer: Self-pay | Admitting: Internal Medicine

## 2017-07-18 ENCOUNTER — Inpatient Hospital Stay (HOSPITAL_COMMUNITY): Payer: BLUE CROSS/BLUE SHIELD | Attending: Internal Medicine | Admitting: Internal Medicine

## 2017-07-18 ENCOUNTER — Inpatient Hospital Stay (HOSPITAL_COMMUNITY): Payer: Self-pay | Attending: Hematology

## 2017-07-18 ENCOUNTER — Other Ambulatory Visit: Payer: Self-pay

## 2017-07-18 VITALS — BP 134/84 | HR 101 | Temp 98.4°F | Resp 18 | Ht 64.0 in | Wt 288.1 lb

## 2017-07-18 DIAGNOSIS — D473 Essential (hemorrhagic) thrombocythemia: Secondary | ICD-10-CM | POA: Diagnosis not present

## 2017-07-18 DIAGNOSIS — Z87891 Personal history of nicotine dependence: Secondary | ICD-10-CM

## 2017-07-18 DIAGNOSIS — R718 Other abnormality of red blood cells: Secondary | ICD-10-CM

## 2017-07-18 DIAGNOSIS — D72828 Other elevated white blood cell count: Secondary | ICD-10-CM | POA: Diagnosis not present

## 2017-07-18 DIAGNOSIS — D75839 Thrombocytosis, unspecified: Secondary | ICD-10-CM

## 2017-07-18 LAB — COMPREHENSIVE METABOLIC PANEL
ALBUMIN: 3.5 g/dL (ref 3.5–5.0)
ALK PHOS: 84 U/L (ref 38–126)
ALT: 19 U/L (ref 14–54)
ANION GAP: 8 (ref 5–15)
AST: 14 U/L — AB (ref 15–41)
BILIRUBIN TOTAL: 0.5 mg/dL (ref 0.3–1.2)
BUN: 9 mg/dL (ref 6–20)
CALCIUM: 9.4 mg/dL (ref 8.9–10.3)
CO2: 26 mmol/L (ref 22–32)
Chloride: 106 mmol/L (ref 101–111)
Creatinine, Ser: 0.71 mg/dL (ref 0.44–1.00)
GFR calc Af Amer: >60 mL/min (ref >60)
GFR calc non Af Amer: >60 mL/min (ref >60)
Glucose, Bld: 111 mg/dL — ABNORMAL HIGH (ref 65–99)
POTASSIUM: 4.3 mmol/L (ref 3.5–5.1)
SODIUM: 140 mmol/L (ref 135–145)
TOTAL PROTEIN: 7.6 g/dL (ref 6.5–8.1)

## 2017-07-18 LAB — CBC WITH DIFFERENTIAL/PLATELET
BASOS PCT: 0 %
Basophils Absolute: 0 10*3/uL (ref 0.0–0.1)
EOS ABS: 0.1 10*3/uL (ref 0.0–0.7)
Eosinophils Relative: 1 %
HEMATOCRIT: 38.5 % (ref 36.0–46.0)
HEMOGLOBIN: 12 g/dL (ref 12.0–15.0)
Lymphocytes Relative: 20 %
Lymphs Abs: 2 10*3/uL (ref 0.7–4.0)
MCH: 26.6 pg (ref 26.0–34.0)
MCHC: 31.2 g/dL (ref 30.0–36.0)
MCV: 85.4 fL (ref 78.0–100.0)
Monocytes Absolute: 0.5 10*3/uL (ref 0.1–1.0)
Monocytes Relative: 5 %
NEUTROS ABS: 7.1 10*3/uL (ref 1.7–7.7)
NEUTROS PCT: 74 %
Platelets: 459 10*3/uL — ABNORMAL HIGH (ref 150–400)
RBC: 4.51 MIL/uL (ref 3.87–5.11)
RDW: 14.3 % (ref 11.5–15.5)
WBC: 9.7 10*3/uL (ref 4.0–10.5)

## 2017-07-18 LAB — LACTATE DEHYDROGENASE: LDH: 121 U/L (ref 98–192)

## 2017-07-18 LAB — SEDIMENTATION RATE: SED RATE: 58 mm/h — AB (ref 0–22)

## 2017-07-18 LAB — C-REACTIVE PROTEIN: CRP: 1.1 mg/dL — AB (ref <1.0)

## 2017-07-18 LAB — FERRITIN: Ferritin: 30 ng/mL (ref 11–307)

## 2017-07-18 NOTE — Patient Instructions (Signed)
[facility] Discharge Instructions  Today you saw Dr. Higgs.    Thank you for choosing [facility] to provide your oncology and hematology care.  To afford each patient quality time with our provider, please arrive at least 15 minutes before your scheduled appointment time.   If you have a lab appointment with the Cancer Center please come in thru the  Main Entrance and check in at the main information desk  You need to re-schedule your appointment should you arrive 10 or more minutes late.  We strive to give you quality time with our providers, and arriving late affects you and other patients whose appointments are after yours.  Also, if you no show three or more times for appointments you may be dismissed from the clinic at the providers discretion.     Again, thank you for choosing [facility].  Our hope is that these requests will decrease the amount of time that you wait before being seen by our physicians.       _____________________________________________________________  Should you have questions after your visit to [facility], please contact our office at [phone] between the hours of 8:30 a.m. and 4:30 p.m.  Voicemails left after 4:30 p.m. will not be returned until the following business day.  For prescription refill requests, have your pharmacy contact our office.       Resources For Cancer Patients and their Caregivers ? American Cancer Society: Can assist with transportation, wigs, general needs, runs Look Good Feel Better.        1-888-227-6333 ? Cancer Care: Provides financial assistance, online support groups, medication/co-pay assistance.  1-800-813-HOPE (4673) ? [facility] Assists [place] cancer patients and their families through emotional , educational and financial support.  336-427-4357 ? [place] DSS Where to apply for  food stamps, Medicaid and utility assistance. 336-342-1394 ? [facility]: Transportation to medical appointments. 336-347-2287 ? Social Security Administration: May apply for disability if have a Stage IV cancer. 336-342-7796 1-800-772-1213 ? [place] Aging, Disability and Transit Services: Assists with nutrition, care and transit needs. 336-349-2343  Cancer Center Support Programs:   > Cancer Support Group  2nd Tuesday of the month 1pm-2pm, Journey Room   > Creative Journey  3rd Tuesday of the month 1130am-1pm, Journey Room    

## 2017-07-18 NOTE — Progress Notes (Signed)
Referring physician:  Dr. Everett Graff, [facility]  Diagnosis Microcytosis - Plan: CBC with Differential/Platelet, Comprehensive metabolic panel, Lactate dehydrogenase, Ferritin, Sedimentation rate, C-reactive protein, BCR-ABL1, CML/ALL, PCR, QUANT, JAK2 genotypr, Hemoglobinopathy evaluation  Other elevated white blood cell (WBC) count - Plan: CBC with Differential/Platelet, Comprehensive metabolic panel, Lactate dehydrogenase, Ferritin, Sedimentation rate, C-reactive protein, BCR-ABL1, CML/ALL, PCR, QUANT, JAK2 genotypr, Hemoglobinopathy evaluation  Thrombocytosis (HCC) - Plan: CBC with Differential/Platelet, Comprehensive metabolic panel, Lactate dehydrogenase, Ferritin, Sedimentation rate, C-reactive protein, BCR-ABL1, CML/ALL, PCR, QUANT, JAK2 genotypr, Hemoglobinopathy evaluation  Staging Cancer Staging No matching staging information was found for the patient.  Assessment and Plan: 1.  Leukocytosis. 27 year old female referred for consultation due to leukocytosis.  The patient reports she has been told in the past her white count was elevated.  She denies any fever, chills, night sweats, and has noted no adenopathy.  She has not been taking prednisone.  She does not smoke.  She denies any family history of leukemias or lymphomas.  She reports problems with joint dislocation.  Review of records show patient had CBC done 06/14/2016 that showed a white count 12.1 hemoglobin 11.9 platelets 423,000.  She also had a CBC done 06/22/2017 that showed a white count of 11.9 hemoglobin 12.4 platelets 439,000.    Long talk care with the patient.  Labs done today 07/18/2017 showed normal white count 9.7 hemoglobin 12 platelets 459,000.  Awaiting results of her BCR/ABL  and Jak 2.  Sed rate is also pending.  I discussed with her she had minimal elevation of white count which was likely a normal variant.  She reports she is scheduled to go out of town until August and she will be notified of her lab  results and will return to clinic for follow-up in August.  All questions answered and she expressed understanding of the information presented.  2.  Microcytosis.  Her hemoglobin is 12 on labs done today 07/18/2017.  MCV had previously been 79 and 80.   MCV on labs done today is 85.   Will check hemoglobin electrophoresis and ferritin.  3.  Joint dislocation.  Will check C-reactive protein and sed rate.  She should continue to follow-up with primary care physician.  HPI: 27 year old female referred for consultation due to leukocytosis.  The patient reports she has been told in the past her white count was elevated.  She denies any fever, chills, night sweats, and has noted no adenopathy.  She has not been taking prednisone.  She does not smoke.  She denies any family history of leukemias or lymphomas.  She reports problems with joint dislocation.  Review of records show patient had CBC done 06/14/2016 that showed a white count 12.1 hemoglobin 11.9 platelets 423,000.  She also had a CBC done 06/22/2017 that showed a white count of 11.9 hemoglobin 12.4 platelets 439,000.    Problem List Patient Active Problem List   Diagnosis Date Noted  . Indication for care in labor or delivery [O75.9] 01/28/2015  . Pregnancy with adoption planned [Z34.90] 01/28/2015  . Spontaneous vaginal delivery [O80] 01/28/2015  . Braxton Hick's contraction [O47.9] 01/26/2015  . Body mass index (BMI) 40.0-44.9, adult ([place]) [Z68.41] 01/26/2015  . Bipolar 1 disorder ([place]) [F31.9] 11/22/2014  . Late prenatal care--new dx pregnancy at 28 weeks [O09.30] 11/22/2014  . BMI 45.0-49.9, adult [facility]) [Z68.42] 11/22/2014    Past Medical History Past Medical History:  Diagnosis Date  . Bipolar 1 disorder ([place])   . Former smoker   . Mental  disorder     Past Surgical History Past Surgical History:  Procedure Laterality Date  . NO PAST SURGERIES      Family History History reviewed. No pertinent family history.   Social  History  reports that she quit smoking about 5 years ago. She has never used smokeless tobacco. She reports that she drinks alcohol. She reports that she does not use drugs.  Medications  Current Outpatient Medications:  .  ibuprofen (ADVIL,MOTRIN) 600 MG tablet, Take 1 tablet (600 mg total) by mouth every 6 (six) hours., Disp: 30 tablet, Rfl: 0 .  Prenatal Vit-Fe Fumarate-FA (PRENATAL VITAMIN PO), Take by mouth., Disp: , Rfl:  .  ferrous sulfate 325 (65 FE) MG EC tablet, Take 1 tablet (325 mg total) by mouth 2 (two) times daily., Disp: 60 tablet, Rfl: 3  Allergies Patient has no known allergies.  Review of Systems Review of Systems - Oncology ROS as per HPI otherwise 12 point ROS is negative other than joint dislocation.     Physical Exam  Vitals Wt Readings from Last 3 Encounters:  07/18/17 288 lb 1.6 oz (130.7 kg)  01/28/15 251 lb (113.9 kg)  01/26/15 251 lb (113.9 kg)   Temp Readings from Last 3 Encounters:  07/18/17 98.4 F (36.9 C) (Oral)  01/30/15 98.2 F (36.8 C)  01/27/15 98.1 F (36.7 C) (Oral)   BP Readings from Last 3 Encounters:  07/18/17 134/84  01/30/15 116/60  01/27/15 110/69   Pulse Readings from Last 3 Encounters:  07/18/17 (!) 101  01/30/15 73  01/27/15 96   Constitutional: Well-developed, well-nourished, and in no distress.   HENT: Head: Normocephalic and atraumatic.  Mouth/Throat: No oropharyngeal exudate. Mucosa moist. Eyes: Pupils are equal, round, and reactive to light. Conjunctivae are normal. No scleral icterus.  Neck: Normal range of motion. Neck supple. No JVD present.  Cardiovascular: Normal rate, regular rhythm and normal heart sounds.  Exam reveals no gallop and no friction rub.   No murmur heard. Pulmonary/Chest: Effort normal and breath sounds normal. No respiratory distress. No wheezes.No rales.  Abdominal: Soft. Bowel sounds are normal. No distension. There is no tenderness. There is no guarding.  Musculoskeletal: No edema or  tenderness.  Lymphadenopathy: No cervical, axillary or supraclavicular adenopathy.  Neurological: Alert and oriented to person, place, and time. No cranial nerve deficit.  Skin: Skin is warm and dry. No rash noted. No erythema. No pallor.  Psychiatric: Affect and judgment normal.   Labs Appointment on 07/18/2017  Component Date Value Ref Range Status  . WBC 07/18/2017 9.7  4.0 - 10.5 K/uL Final  . RBC 07/18/2017 4.51  3.87 - 5.11 MIL/uL Final  . Hemoglobin 07/18/2017 12.0  12.0 - 15.0 g/dL Final  . HCT 07/18/2017 38.5  36.0 - 46.0 % Final  . MCV 07/18/2017 85.4  78.0 - 100.0 fL Final  . MCH 07/18/2017 26.6  26.0 - 34.0 pg Final  . MCHC 07/18/2017 31.2  30.0 - 36.0 g/dL Final  . RDW 07/18/2017 14.3  11.5 - 15.5 % Final  . Platelets 07/18/2017 459* 150 - 400 K/uL Final  . Neutrophils Relative % 07/18/2017 74  % Final  . Neutro Abs 07/18/2017 7.1  1.7 - 7.7 K/uL Final  . Lymphocytes Relative 07/18/2017 20  % Final  . Lymphs Abs 07/18/2017 2.0  0.7 - 4.0 K/uL Final  . Monocytes Relative 07/18/2017 5  % Final  . Monocytes Absolute 07/18/2017 0.5  0.1 - 1.0 K/uL Final  . Eosinophils Relative 07/18/2017  1  % Final  . Eosinophils Absolute 07/18/2017 0.1  0.0 - 0.7 K/uL Final  . Basophils Relative 07/18/2017 0  % Final  . Basophils Absolute 07/18/2017 0.0  0.0 - 0.1 K/uL Final   Performed at [facility], [address] [place], [place] [address]  . Sodium 07/18/2017 140  135 - 145 mmol/L Final  . Potassium 07/18/2017 4.3  3.5 - 5.1 mmol/L Final  . Chloride 07/18/2017 106  101 - 111 mmol/L Final  . CO2 07/18/2017 26  22 - 32 mmol/L Final  . Glucose, Bld 07/18/2017 111* 65 - 99 mg/dL Final  . BUN 07/18/2017 9  6 - 20 mg/dL Final  . Creatinine, Ser 07/18/2017 0.71  0.44 - 1.00 mg/dL Final  . Calcium 07/18/2017 9.4  8.9 - 10.3 mg/dL Final  . Total Protein 07/18/2017 7.6  6.5 - 8.1 g/dL Final  . Albumin 07/18/2017 3.5  3.5 - 5.0 g/dL Final  . AST 07/18/2017 14* 15 - 41 U/L Final  . ALT  07/18/2017 19  14 - 54 U/L Final  . Alkaline Phosphatase 07/18/2017 84  38 - 126 U/L Final  . Total Bilirubin 07/18/2017 0.5  0.3 - 1.2 mg/dL Final  . GFR calc non Af Amer 07/18/2017 >60  >60 mL/min Final  . GFR calc Af Amer 07/18/2017 >60  >60 mL/min Final   Comment: (NOTE) The eGFR has been calculated using the CKD EPI equation. This calculation has not been validated in all clinical situations. eGFR's persistently <60 mL/min signify possible Chronic Kidney Disease.   Georgiann Hahn gap 07/18/2017 8  5 - 15 Final   Performed at [facility], [address], [place], [place] [address]  . LDH 07/18/2017 121  98 - 192 U/L Final   Performed at [facility], [address], [place], [place] [address]  . Sed Rate 07/18/2017 58* 0 - 22 mm/hr Final   Performed at [facility], [address] [place], [place] [address]     Pathology Orders Placed This Encounter  Procedures  . CBC with Differential/Platelet    Standing Status:   Future    Number of Occurrences:   1    Standing Expiration Date:   07/19/2018  . Comprehensive metabolic panel    Standing Status:   Future    Number of Occurrences:   1    Standing Expiration Date:   07/19/2018  . Lactate dehydrogenase    Standing Status:   Future    Number of Occurrences:   1    Standing Expiration Date:   07/19/2018  . Ferritin    Standing Status:   Future    Number of Occurrences:   1    Standing Expiration Date:   07/19/2018  . Sedimentation rate    Standing Status:   Future    Number of Occurrences:   1    Standing Expiration Date:   07/19/2018  . C-reactive protein    Standing Status:   Future    Number of Occurrences:   1    Standing Expiration Date:   07/19/2018  . BCR-ABL1, CML/ALL, PCR, QUANT    Standing Status:   Future    Number of Occurrences:   1    Standing Expiration Date:   07/19/2018  . JAK2 genotypr    Standing Status:   Future    Number of Occurrences:   1    Standing Expiration Date:   07/19/2018  . Hemoglobinopathy  evaluation  Standing Status:   Future    Number of Occurrences:   1    Standing Expiration Date:   07/19/2018       Zoila Shutter MD

## 2017-07-19 ENCOUNTER — Telehealth (HOSPITAL_COMMUNITY): Payer: Self-pay

## 2017-07-19 LAB — HEMOGLOBINOPATHY EVALUATION
HGB A2 QUANT: 2.1 % (ref 1.8–3.2)
HGB C: 0 %
HGB F QUANT: 0 % (ref 0.0–2.0)
HGB S QUANTITAION: 0 %
Hgb A: 97.9 % (ref 96.4–98.8)
Hgb Variant: 0 %

## 2017-07-19 NOTE — Telephone Encounter (Signed)
Patient called concerning lab results.  Message left on voice mail WBC were normal per Dr. Walden Field and she will review more in depth with follow up appointment in August.

## 2017-07-21 LAB — JAK2 GENOTYPR

## 2017-07-25 LAB — BCR-ABL1, CML/ALL, PCR, QUANT

## 2017-09-11 ENCOUNTER — Ambulatory Visit (HOSPITAL_COMMUNITY): Payer: Self-pay | Admitting: Internal Medicine

## 2019-04-20 ENCOUNTER — Ambulatory Visit: Payer: Self-pay | Attending: Internal Medicine

## 2019-04-20 DIAGNOSIS — Z23 Encounter for immunization: Secondary | ICD-10-CM

## 2019-04-20 NOTE — Progress Notes (Signed)
   Covid-19 Vaccination Clinic  Name:  REIZY SCHURMAN    MRN: [id number] DOB: Sep 02, 1990  04/20/2019  Ms. Melley was observed post Covid-19 immunization for 15 minutes without incident. She was provided with Vaccine Information Sheet and instruction to access the V-Safe system.   Ms. Coverstone was instructed to call 911 with any severe reactions post vaccine: Marland Kitchen Difficulty breathing  . Swelling of face and throat  . A fast heartbeat  . A bad rash all over body  . Dizziness and weakness   Immunizations Administered    Name Date Dose VIS Date Route   Pfizer COVID-19 Vaccine 04/20/2019 12:33 PM 0.3 mL 01/18/2019 Intramuscular   Manufacturer: [place]   Lot: [id number]   [place]: [id number]

## 2019-05-13 ENCOUNTER — Ambulatory Visit: Payer: Self-pay

## 2019-05-13 ENCOUNTER — Ambulatory Visit: Payer: Self-pay | Attending: Internal Medicine

## 2019-05-13 DIAGNOSIS — Z23 Encounter for immunization: Secondary | ICD-10-CM

## 2019-05-13 NOTE — Progress Notes (Signed)
   Covid-19 Vaccination Clinic  Name:  Anne Marks    MRN: [id number] DOB: May 20, 1990  05/13/2019  Anne Marks was observed post Covid-19 immunization for 15 minutes without incident. She was provided with Vaccine Information Sheet and instruction to access the V-Safe system.   Anne Marks was instructed to call 911 with any severe reactions post vaccine: Marland Kitchen Difficulty breathing  . Swelling of face and throat  . A fast heartbeat  . A bad rash all over body  . Dizziness and weakness   Immunizations Administered    Name Date Dose VIS Date Route   Pfizer COVID-19 Vaccine 05/13/2019  3:13 PM 0.3 mL 01/18/2019 Intramuscular   Manufacturer: [place]   Lot: [id number]   [place]: [id number]

## 2022-04-01 ENCOUNTER — Ambulatory Visit (INDEPENDENT_AMBULATORY_CARE_PROVIDER_SITE_OTHER): Payer: Commercial Managed Care - HMO | Admitting: Behavioral Health

## 2022-04-01 DIAGNOSIS — F4310 Post-traumatic stress disorder, unspecified: Secondary | ICD-10-CM

## 2022-04-01 DIAGNOSIS — F3181 Bipolar II disorder: Secondary | ICD-10-CM | POA: Diagnosis not present
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
## 2022-04-01 NOTE — Progress Notes (Signed)
                Anne Marks Anne Marks, LCMHC 

## 2022-04-08 ENCOUNTER — Encounter: Payer: Self-pay | Admitting: Behavioral Health

## 2022-04-11 ENCOUNTER — Ambulatory Visit: Payer: Commercial Managed Care - HMO | Admitting: Behavioral Health

## 2022-04-13 ENCOUNTER — Ambulatory Visit (INDEPENDENT_AMBULATORY_CARE_PROVIDER_SITE_OTHER): Payer: Commercial Managed Care - HMO | Admitting: Behavioral Health

## 2022-04-13 DIAGNOSIS — F411 Generalized anxiety disorder: Secondary | ICD-10-CM

## 2022-04-13 DIAGNOSIS — F331 Major depressive disorder, recurrent, moderate: Secondary | ICD-10-CM | POA: Diagnosis not present

## 2022-04-13 DIAGNOSIS — F3181 Bipolar II disorder: Secondary | ICD-10-CM

## 2022-04-13 NOTE — Progress Notes (Unsigned)
[facility] Counselor Initial Adult Exam

## 2022-04-13 NOTE — Progress Notes (Addendum)
[facility] Counselor/Therapist Progress Note  Patient ID: Anne Marks, MRN: [id number],    Date: 04/13/2022 Length of session: 56 minutes Time Spent: I met with the patient via video session.  The patient was at home and this therapist was in his home office.  Treatment Type: Individual Therapy  Reported Symptoms: anxiety  Mental Status Exam: Appearance:  Well Groomed     Behavior: Appropriate  Motor: Normal  Speech/Language:  Clear and Coherent  Affect: Appropriate  Mood: normal  Thought process: normal  Thought content:   WNL  Sensory/Perceptual disturbances:   WNL  Orientation: oriented to person, place, time/date, situation, day of week, month of year, and year  Attention: Good  Concentration: Good  Memory: WNL  Fund of knowledge:  Good  Insight:   Good  Judgment:  Good  Impulse Control: Good   Risk Assessment: Danger to Self:  No Self-injurious Behavior: No Danger to Others: No Duty to Warn:no Physical Aggression / Violence:No  Access to Firearms a concern: No  Gang Involvement:No   Subjective: The patient's job is still going well.  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  The patient reports that she has been introspective a lot recently and 2 or 3 days before the session was struggling with some of her "thinking thoughts.".  There has been some alleviation of some of that distress over the past couple of days.  There are some things about her past that have triggered some thoughts feelings emotion based on some conversation with her partner.  She is wrestling with whether that is on her because he unintentionally may have said or done something that triggered her.  We did process what she was thinking and feeling and validated how that played into her reaction but also stressed the importance of open and honest communication with her partner about her thoughts and feelings.  There is some anxiety  associated with that so we talked about how in [place] would be the best time to do that but stressed the importance of that while at the same time using coping skills prior to and during the conversation. Treatment plan: Cognitive behavioral therapy principles as well as elements of dialectical behavior therapy and ego supportive therapy were used to help the patient achieve goals of reduction of anxiety, improvement in depression and continued improvement in maintaining and strengthening a healthy dating relationship.  As I had seen this patient previously there had already been significant improvement in her part in a healthy relationship by at least 50% over the past year.  Coping skills and self reflection as well as using cognitive principles of reframing have also helped reduce depression by at least 50% and improve anxiety by at least 30 to 40%.  Specifically with anxiety goals will be to continue to help the patient develop strategies for reducing symptoms of anxiety and add coping skills.  She would like to be free of anxiety at least 50% of the time and limit panic attacks to 1/week per patient report over the next several weeks.  Goals her depression will per patient report be to reduce depressive symptoms of at least 25% over the past 6 months that she has already made significant improvement in that over the past year.  Target date: October 15, 2022. Interventions:  Cognitive Behavioral Therapy Diagnosis:Bi-Polar d/o  Plan: f/u appointments scheduled.  The patient prefers to meet every other week via video session.  Sabas Sous, [facility]

## 2022-04-14 ENCOUNTER — Encounter: Payer: Self-pay | Admitting: Behavioral Health

## 2022-04-25 ENCOUNTER — Encounter: Payer: Self-pay | Admitting: Behavioral Health

## 2022-04-25 ENCOUNTER — Ambulatory Visit (INDEPENDENT_AMBULATORY_CARE_PROVIDER_SITE_OTHER): Payer: Commercial Managed Care - HMO | Admitting: Behavioral Health

## 2022-04-25 DIAGNOSIS — F3181 Bipolar II disorder: Secondary | ICD-10-CM

## 2022-04-25 NOTE — Progress Notes (Signed)
[place] Counselor/Therapist Progress Note  Patient ID: Anne Marks, MRN: [id number],    Date: 04/25/2022 Length of session: 56 minutes Time Spent: I met with the patient via video session.  The patient was at home and this therapist was in his outpatient therapy office.  Treatment Type: Individual Therapy  Reported Symptoms: anxiety  Mental Status Exam: Appearance:  Well Groomed     Behavior: Appropriate  Motor: Normal  Speech/Language:  Clear and Coherent  Affect: Appropriate  Mood: normal  Thought process: normal  Thought content:   WNL  Sensory/Perceptual disturbances:   WNL  Orientation: oriented to person, place, time/date, situation, day of week, month of year, and year  Attention: Good  Concentration: Good  Memory: WNL  Fund of knowledge:  Good  Insight:   Good  Judgment:  Good  Impulse Control: Good   Risk Assessment: Danger to Self:  No Self-injurious Behavior: No Danger to Others: No Duty to Warn:no Physical Aggression / Violence:No  Access to Firearms a concern: No  Gang Involvement:No   Subjective: The patient had a very difficult situation at work last week.  She was very mindful of the fact where she was emotionally in the moment was able to keep her head calm as well as keep others around her calm.  She recognizes afterwards how exhausted she was and did practice good self-care at that point.  We talked about the importance of being aware of those unexpected situations in her profession that can be emotionally mentally and physically exhausting and the best ways to recognize them and to cope with them.    She also had a conversation with her partner over the weekend.  They revolve primarily around how they process certain things including the homework.  He had her walk previously that she must not like her job because she was telling him a few things about it so she has stopped telling him things and processed that particular situation  described above with a friend and peer  She says it feels like there is an imbalance in how she and her partner respond to the feelings for one.  She typically bottles it up to give herself some time to think about it but does acknowledge at times it can be an avoidant behavior but she is mindful of that.  Her partner is more emotionally expressive and the moments asking for time to think through things with the expectation of the patient to be patient.  When she takes time to think through this situation with the feelings he tells her that he is feeling lonely and questions where she is in the relationship.  She addressed the very directly and assertively and after some difficult conversations he does seem to at least be starting to see her perspective and they are working toward perspective toward each other's responses as well as improved and healthier communication along those lines.  Treatment plan: Cognitive behavioral therapy principles as well as elements of dialectical behavior therapy and ego supportive therapy were used to help the patient achieve goals of reduction of anxiety, improvement in depression and continued improvement in maintaining and strengthening a healthy dating relationship.  As I had seen this patient previously there had already been significant improvement in her part in a healthy relationship by at least 50% over the past year.  Coping skills and self reflection as well as using cognitive principles of reframing have also helped reduce depression by at least 50% and improve anxiety  by at least 30 to 40%.  Specifically with anxiety goals will be to continue to help the patient develop strategies for reducing symptoms of anxiety and add coping skills.  She would like to be free of anxiety at least 50% of the time and limit panic attacks to 1/week per patient report over the next several weeks.  Goals her depression will per patient report be to reduce depressive symptoms of at least  25% over the past 6 months that she has already made significant improvement in that over the past year.  Target date: October 15, 2022. 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  The patient prefers to meet every other week via video session.  Sabas Sous, [place] Anne Marks, [facility]

## 2022-05-09 ENCOUNTER — Ambulatory Visit (INDEPENDENT_AMBULATORY_CARE_PROVIDER_SITE_OTHER): Payer: Commercial Managed Care - HMO | Admitting: Behavioral Health

## 2022-05-09 ENCOUNTER — Encounter: Payer: Self-pay | Admitting: Behavioral Health

## 2022-05-09 DIAGNOSIS — F3181 Bipolar II disorder: Secondary | ICD-10-CM

## 2022-05-09 NOTE — Progress Notes (Signed)
[place] Counselor/Therapist Progress Note  Patient ID: KURSTIE PHILSON, MRN: [id number],    Date: 05/09/2022 Length of session: 56 minutes Time Spent: I met with the patient via video session.  The patient was at home and this therapist was in his outpatient therapy office.  Treatment Type: Individual Therapy  Reported Symptoms: anxiety  Mental Status Exam: Appearance:  Well Groomed     Behavior: Appropriate  Motor: Normal  Speech/Language:  Clear and Coherent  Affect: Appropriate  Mood: normal  Thought process: normal  Thought content:   WNL  Sensory/Perceptual disturbances:   WNL  Orientation: oriented to person, place, time/date, situation, day of week, month of year, and year  Attention: Good  Concentration: Good  Memory: WNL  Fund of knowledge:  Good  Insight:   Good  Judgment:  Good  Impulse Control: Good   Risk Assessment: Danger to Self:  No Self-injurious Behavior: No Danger to Others: No Duty to Warn:no Physical Aggression / Violence:No  Access to Firearms a concern: No  Gang Involvement:No   Subjective: The patient had a good Easter weekend.  She went to an anime convention with her friends and spent a very quiet Easter day with her family.  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  She recognizes that not only the number of patients that she needs to see but the acuity of her patient population.  The patient is increasingly aware of what she wants relationships look like and how healthy they can be.  She is 80% sure that her partner is on the autism spectrum and therefore there are some challenges created in terms of their communication and his perception.  She recognizes that over time and trusts that she becomes more comfortable in who she is and  how she shows that.  He is beginning to see more of that as they have progressed in the relationship and does not know what to do without necessarily.  He addressed that to her and very direct ways which appeared to be somewhat frustrating for the patient.  We talked about his ability to adapt there is to coach him through some of what he perceives as huge changes that are fairly linear for the patient.  Treatment plan: Cognitive behavioral therapy principles as well as elements of dialectical behavior therapy and ego supportive therapy were used to help the patient achieve goals of reduction of anxiety, improvement in depression and continued improvement in maintaining and strengthening a healthy dating relationship.  As I had seen this patient previously there had already been significant improvement in her part in a healthy relationship by at least 50% over the past year.  Coping skills and self reflection as well as using cognitive principles of re framing have also helped reduce depression by at least 50% and improve anxiety by at least 30 to 40%.  Specifically with anxiety goals will be to continue to help the patient develop strategies for reducing symptoms of anxiety and add coping skills.  She would like to be free of anxiety at least 50% of the time and limit panic attacks to 1/week per patient report over the next several weeks.  Goals her depression will per patient report be to reduce depressive symptoms of at least  25% over the past 6 months that she has already made significant improvement in that over the past year.  Target date: October 15, 2022. 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  The patient prefers to meet every other week via video session.  Sabas Sous, [place] Tenlee Wollin, [place] Ellanie Oppedisano, [facility]

## 2022-05-23 ENCOUNTER — Ambulatory Visit (INDEPENDENT_AMBULATORY_CARE_PROVIDER_SITE_OTHER): Payer: Commercial Managed Care - HMO | Admitting: Behavioral Health

## 2022-05-23 ENCOUNTER — Encounter: Payer: Self-pay | Admitting: Behavioral Health

## 2022-05-23 DIAGNOSIS — F3181 Bipolar II disorder: Secondary | ICD-10-CM

## 2022-05-23 NOTE — Progress Notes (Signed)
  [place] [facility] Counselor/Therapist Progress Note  Patient ID: Anne Marks, MRN: [id number],    Date: 05/23/2022 Length of session: 57 minutes Time Spent: I met with the patient via video session.  The patient was at home and this therapist was in his outpatient therapy office.  Treatment Type: Individual Therapy  Reported Symptoms: anxiety  Mental Status Exam: Appearance:  Well Groomed     Behavior: Appropriate  Motor: Normal  Speech/Language:  Clear and Coherent  Affect: Appropriate  Mood: normal  Thought process: normal  Thought content:   WNL  Sensory/Perceptual disturbances:   WNL  Orientation: oriented to person, place, time/date, situation, day of week, month of year, and year  Attention: Good  Concentration: Good  Memory: WNL  Fund of knowledge:  Good  Insight:   Good  Judgment:  Good  Impulse Control: Good   Risk Assessment: Danger to Self:  No Self-injurious Behavior: No Danger to Others: No Duty to Warn:no Physical Aggression / Violence:No  Access to Firearms a concern: No  Gang Involvement:No   Subjective: The patient was tired today during session.  Her partner had been here for the weekend and they went to an event and so they were busy all weekend.  The patient nausea being a social along was exhausting for both she and her partner.  Possible slight bump up which was very stressful for the patient.  She recognized the need for self-care in the moment and use several coping skills to help her destress from that event.  Her partner was also very helpful.  She reports that it was excellent communication and out of a conversation for both of them over the weekend.  She was able to exam face-to-face that she had been previously but that was in a level of understanding for him.  She felt that was a big step in the relationship.  The patient is very mindful and self-aware and is explaining to him who she is versus who she was several years ago.  She is much  more aware of what a healthy relationship looks like to her and knows that is what she wants out of the relationship. Target date: October 15, 2022. 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  The patient prefers to meet every other week via video session.  [name], [facility]                 [name], [facility]               [name], [facility]       [name], [facility]

## 2022-06-03 ENCOUNTER — Ambulatory Visit (INDEPENDENT_AMBULATORY_CARE_PROVIDER_SITE_OTHER): Payer: Commercial Managed Care - HMO | Admitting: Behavioral Health

## 2022-06-03 ENCOUNTER — Encounter: Payer: Self-pay | Admitting: Behavioral Health

## 2022-06-03 DIAGNOSIS — F319 Bipolar disorder, unspecified: Secondary | ICD-10-CM | POA: Diagnosis not present

## 2022-06-03 DIAGNOSIS — F3181 Bipolar II disorder: Secondary | ICD-10-CM

## 2022-06-03 NOTE — Progress Notes (Signed)
[place] [facility] Counselor/Therapist Progress Note  Patient ID: SHONIQUE PELPHREY, MRN: [id number],    Date: 06/03/2022 Length of session: 57 minutes Time Spent: I met with the patient via video session.  The patient was at home and this therapist was in his home therapy office.  Treatment Type: Individual Therapy  Reported Symptoms: anxiety  Mental Status Exam: Appearance:  Well Groomed     Behavior: Appropriate  Motor: Normal  Speech/Language:  Clear and Coherent  Affect: Appropriate  Mood: normal  Thought process: normal  Thought content:   WNL  Sensory/Perceptual disturbances:   WNL  Orientation: oriented to person, place, time/date, situation, day of week, month of year, and year  Attention: Good  Concentration: Good  Memory: WNL  Fund of knowledge:  Good  Insight:   Good  Judgment:  Good  Impulse Control: Good   Risk Assessment: Danger to Self:  No Self-injurious Behavior: No Danger to Others: No Duty to Warn:no Physical Aggression / Violence:No  Access to Firearms a concern: No  Gang Involvement:No   Subjective: I received the news late yesterday afternoon that her father has been diagnosed with Parkinson's disease.  She acknowledges that she was caught completely off guard and is still somewhat in shock saying there are millions of thoughts running around her head as she is trying to process what to do.  He went to the emergency room think he had a kidney stone.  That turned out to be shingles but in being thorough they also did a head scan which showed some deficits in the middle brain.  They met with a neurologist yesterday who gave him the news of the Parkinson's diagnosis but also encouraged her parents that there are many more ways that they can treat that and the quality and duration of life can be much more extensive than it used to be.  The patient is aware of that but she is trying to think logistically of how she can help her parents.  I validated her  feelings of being overwhelmed and confused angry etc. as well as the thoughts that she is having but we talked about slowing down and looking at what she can do a day at a time.  She has been journaling which is good.  She is always being good to reach out to her parents and support them.  Her father is a very important figure in her life and she is thinking down the road of what might happen so I encouraged her to attempt to cognitively plan and not project.  She will meet with her parents and the neurologist next week so that she can better understand what is going on.  She said basically she just wants to cry or sleep all the time right now which she knows she needs to find a balance between that and allowing others to reach out to her.  I encouraged her to do something to get her moving and to spend tonight tomorrow this weekend with friends and other family members and let them support her.  Target date: October 15, 2022. 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  The patient prefers to meet every other week via video session.  [name], [facility]  Alaycia Eardley M Ileana Chalupa, LCMHC 

## 2022-06-06 ENCOUNTER — Encounter: Payer: Self-pay | Admitting: Behavioral Health

## 2022-06-06 ENCOUNTER — Ambulatory Visit (INDEPENDENT_AMBULATORY_CARE_PROVIDER_SITE_OTHER): Payer: Commercial Managed Care - HMO | Admitting: Behavioral Health

## 2022-06-06 DIAGNOSIS — F3181 Bipolar II disorder: Secondary | ICD-10-CM | POA: Diagnosis not present

## 2022-06-06 NOTE — Progress Notes (Signed)
[place] [facility] Counselor/Therapist Progress Note  Patient ID: KALYSSA ANKER, MRN: [id number],    Date: 06/06/2022 Length of session: 57 minutes Time Spent: I met with the patient via video session.  The patient was at home and this therapist was in his outpatient therapy office  Treatment Type: Individual Therapy  Reported Symptoms: anxiety  Mental Status Exam: Appearance:  Well Groomed     Behavior: Appropriate  Motor: Normal  Speech/Language:  Clear and Coherent  Affect: Appropriate  Mood: normal  Thought process: normal  Thought content:   WNL  Sensory/Perceptual disturbances:   WNL  Orientation: oriented to person, place, time/date, situation, day of week, month of year, and year  Attention: Good  Concentration: Good  Memory: WNL  Fund of knowledge:  Good  Insight:   Good  Judgment:  Good  Impulse Control: Good   Risk Assessment: Danger to Self:  No Self-injurious Behavior: No Danger to Others: No Duty to Warn:no Physical Aggression / Violence:No  Access to Firearms a concern: No  Gang Involvement:No   Subjective: The patient saw her father over the weekend and she is seeing him made her feel much better.  He reassured her that he was not necessarily experiencing any symptoms and felt fine.  She spoke to a friend whose father had Parkinson's and he lived over 15 years well with the diagnosis.  The patient will also go to her father's neurology appointment this week which she knows will help her answer some questions also.  The patient's partner saw his child over the weekend which erase some questions for him which he and the patient talked about.  He raised the possibility of having children knowing that the patient would like more children.  They look at obstacles physically and mentally emotionally as well as possibilities.  The patient has some serious concerns some of which she has addressed with her partner and some of which she knows she needs to.  She  acknowledges that in the past when she felt like her point was not being heard or taken well it escalated to anger.  She has grown a lot in that way and does not get as angry and does not want to get that angry.  There are times not just with her partner that she still feels the need to do that because she feels that she is not being heard and that is frustrating for her.  At the end of the session she brought up the fact that she thinks how she looks physically does not always lead to people taking her seriously.  That is something we have processed in the next session.  Also ask her to look at a percentage of the amount of time that she felt that she had to explain something again or allow it to get to the point of her getting frustrated/angry to get her point across, particularly in conversation with her partner.  The patient does contract for safety having no thoughts of hurting herself or anyone else. Target date: October 15, 2022. 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  The patient prefers to meet every other week via video session.  [name], [facility]  Sabas Sous, [place] Bartholomew Ramesh, [facility]

## 2022-06-20 ENCOUNTER — Encounter: Payer: Self-pay | Admitting: Behavioral Health

## 2022-06-20 ENCOUNTER — Ambulatory Visit (INDEPENDENT_AMBULATORY_CARE_PROVIDER_SITE_OTHER): Payer: Commercial Managed Care - HMO | Admitting: Behavioral Health

## 2022-06-20 DIAGNOSIS — F3181 Bipolar II disorder: Secondary | ICD-10-CM

## 2022-06-20 NOTE — Progress Notes (Signed)
       [place] [facility] Counselor/Therapist Progress Note  Patient ID: Anne Marks, MRN: [id number],    Date: 06/20/2022 Length of session: 57 minutes Time Spent: I met with the patient via video session.  The patient was at home and this therapist was in his outpatient therapy office  Treatment Type: Individual Therapy  Reported Symptoms: anxiety  Mental Status Exam: Appearance:  Well Groomed     Behavior: Appropriate  Motor: Normal  Speech/Language:  Clear and Coherent  Affect: Appropriate  Mood: normal  Thought process: normal  Thought content:   WNL  Sensory/Perceptual disturbances:   WNL  Orientation: oriented to person, place, time/date, situation, day of week, month of year, and year  Attention: Good  Concentration: Good  Memory: WNL  Fund of knowledge:  Good  Insight:   Good  Judgment:  Good  Impulse Control: Good   Risk Assessment: Danger to Self:  No Self-injurious Behavior: No Danger to Others: No Duty to Warn:no Physical Aggression / Violence:No  Access to Firearms a concern: No  Gang Involvement:No   Subjective: The patient went with her father to his neurologist appointment and said that brought quite a bit of relief.  She went into the appointment informed and said the neurologist was very patient and thorough in answering questions and reassuring.  Her father is handling the situation well.  She went to his graduation over the weekend and is very proud of him.  She also got to spend time with her mother as well as extended family.  She talked her daughter will get to see her a couple of times over the next few weeks which she is excited about.  She also had a conversation with someone that she used to be good friends with which made her think about looking at her future especially in relation to her partner.  They have been dating over a year and she is thinking more about what she wants her future to look like.  Professionally she is in a very  good place but personally she has things that is from life and wants to know that he is thinking about those even if they are not immediate.  I encouraged her to make a pretty checklist of things that she wants in life and then think about conversations that she has had with him and see if she has a sense that he wants those things also so that she can formulate how she wants to address that with him and when she wants to address that with him. The patient does contract for safety having no thoughts of hurting herself or anyone else. Target date: October 15, 2022. 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  The patient prefers to meet every other week via video session.  [name], [facility]                                                      [name], [facility]               [name], [facility]

## 2022-07-08 ENCOUNTER — Encounter: Payer: Self-pay | Admitting: Behavioral Health

## 2022-07-08 ENCOUNTER — Ambulatory Visit (INDEPENDENT_AMBULATORY_CARE_PROVIDER_SITE_OTHER): Payer: Commercial Managed Care - HMO | Admitting: Behavioral Health

## 2022-07-08 DIAGNOSIS — F3181 Bipolar II disorder: Secondary | ICD-10-CM | POA: Diagnosis not present

## 2022-07-08 NOTE — Progress Notes (Signed)
[place] [facility] Counselor/Therapist Progress Note  Patient ID: Anne Marks, MRN: [id number],    Date: 07/08/2022 Length of session: 57 minutes Time Spent: I met with the patient via video session.  The patient was at home and this therapist was in his homeoffice  Treatment Type: Individual Therapy  Reported Symptoms: anxiety  Mental Status Exam: Appearance:  Well Groomed     Behavior: Appropriate  Motor: Normal  Speech/Language:  Clear and Coherent  Affect: Appropriate  Mood: normal  Thought process: normal  Thought content:   WNL  Sensory/Perceptual disturbances:   WNL  Orientation: oriented to person, place, time/date, situation, day of week, month of year, and year  Attention: Good  Concentration: Good  Memory: WNL  Fund of knowledge:  Good  Insight:   Good  Judgment:  Good  Impulse Control: Good   Risk Assessment: Danger to Self:  No Self-injurious Behavior: No Danger to Others: No Duty to Warn:no Physical Aggression / Violence:No  Access to Firearms a concern: No  Gang Involvement:No   Subjective: The patient had 2 opportunities to spend with her daughter and had a great time.  The father of her child was also present and she says that they have learned how to work with each other and now have an amicable relationship which she appreciates.  Her good friend also was able to spend Memorial Day weekend with her and go to the celebration that was at her daughter's adoptive parents home and they all had a good time.  The friend made some observations about the patient interacting with her ex partner who is the father of her child and also some observations about the patient and her current partner which she describes as beginning "stirring her brain.".  It has led the patient to take a deeper time into her current relationship but also how some of her past is affecting her currently in thoughts and feelings as well as current relationship.  She says that there is  one beginning "trench" that has had a cover on it for a long time and her friend ripped the cover open and started pulling things out with good intention but which has stirred some difficult feelings for the patient.  Intellectually she recognizes the need to start addressing some of that but emotionally is not sure she is prepared for that.  A lot of things and that trying to relate to the patient's self image and how that affects where she is currently.  We talked about how to start approaching the things that are in that range including what is in the transient how slowly we start to unpack some of that.  I encouraged the patient use of coping skills especially as we talked about some difficult things and spending good time with her friends over the weekend. The patient does contract for safety having no thoughts of hurting herself or anyone else. Target date: October 15, 2022. 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  The patient prefers to meet every other week via video session.  [name], [facility]  [name], [facility]

## 2022-07-18 ENCOUNTER — Encounter: Payer: Self-pay | Admitting: Behavioral Health

## 2022-07-18 ENCOUNTER — Ambulatory Visit (INDEPENDENT_AMBULATORY_CARE_PROVIDER_SITE_OTHER): Payer: Commercial Managed Care - HMO | Admitting: Behavioral Health

## 2022-07-18 DIAGNOSIS — F3181 Bipolar II disorder: Secondary | ICD-10-CM | POA: Diagnosis not present

## 2022-07-18 NOTE — Progress Notes (Signed)
   [facility] Counselor/Therapist Progress Note  Patient ID: BIJAL SIGLIN, MRN: [id number],    Date: 07/18/2022 Length of session: 57 minutes Time Spent: I met with the patient via video session.  The patient was at home and this therapist was in his outpatient office.  Treatment Type: Individual Therapy  Reported Symptoms: anxiety  Mental Status Exam: Appearance:  Well Groomed     Behavior: Appropriate  Motor: Normal  Speech/Language:  Clear and Coherent  Affect: Appropriate  Mood: normal  Thought process: normal  Thought content:   WNL  Sensory/Perceptual disturbances:   WNL  Orientation: oriented to person, place, time/date, situation, day of week, month of year, and year  Attention: Good  Concentration: Good  Memory: WNL  Fund of knowledge:  Good  Insight:   Good  Judgment:  Good  Impulse Control: Good   Risk Assessment: Danger to Self:  No Self-injurious Behavior: No Danger to Others: No Duty to Warn:no Physical Aggression / Violence:No  Access to Firearms a concern: No  Gang Involvement:No   Subjective: The patient decided it was time for new or call her so she bought something that she is very excited about.  She was driving a bicycle with her car which she gave to her sister but her sister's response was frustrating to her.  It did create her looking a little deeper into her family dynamics with her parents and her 2 sisters and 1 brother.  The patient has always taken great pride in being independent or accepting help while at the same time helping her parents and her siblings as much as she can in multiple ways.  She was a little frustrated by her sister's response to an act of kindness.  We also talked more about her relationship with her partner using the analogy of pouring a foundation but also starting to build the studs in the life in general.  We talked briefly about what those studs are had for homework ask her to think more about what she wants  those studs to look into building that relationship.  The patient does contract for safety having no thoughts of hurting herself or anyone else. Target date: October 15, 2022. 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  The patient prefers to meet every other week via video session.  [name], [facility]                                                                                    [name], [facility]               [name], [facility]

## 2022-08-01 ENCOUNTER — Ambulatory Visit (INDEPENDENT_AMBULATORY_CARE_PROVIDER_SITE_OTHER): Payer: Commercial Managed Care - HMO | Admitting: Behavioral Health

## 2022-08-01 ENCOUNTER — Encounter: Payer: Self-pay | Admitting: Behavioral Health

## 2022-08-01 DIAGNOSIS — F3181 Bipolar II disorder: Secondary | ICD-10-CM | POA: Diagnosis not present

## 2022-08-01 NOTE — Progress Notes (Signed)
[facility] Counselor/Therapist Progress Note  Patient ID: Anne Marks, MRN: [id number],    Date: 08/01/2022 Length of session: 57 minutes, 10:02 AM to 10:59 AM Time Spent: This session was held via video teletherapy. The patient consented to the video teletherapy and was located in her home during this session. She is aware it is the responsibility of the patient to secure confidentiality on her end of the session. The provider was in his outpatient therapy office for the duration of this session.      Treatment Type: Individual Therapy  Reported Symptoms: anxiety, depression  Mental Status Exam: Appearance:  Well Groomed     Behavior: Appropriate  Motor: Normal  Speech/Language:  Clear and Coherent  Affect: Appropriate  Mood: normal  Thought process: normal  Thought content:   WNL  Sensory/Perceptual disturbances:   WNL  Orientation: oriented to person, place, time/date, situation, day of week, month of year, and year  Attention: Good  Concentration: Good  Memory: WNL  Fund of knowledge:  Good  Insight:   Good  Judgment:  Good  Impulse Control: Good   Risk Assessment: Danger to Self:  No Self-injurious Behavior: No Danger to Others: No Duty to Warn:no Physical Aggression / Violence:No  Access to Firearms a concern: No  Gang Involvement:No   Subjective: The patient reports that there have been some mood instability over the past few weeks.  This is evidenced by not practicing great self-care either hygienically or taking care of her living space.  She reports an increase in the personal self-care but says her room is still a mess we talked about picking out one small project in the room to address first and leaving it at that gradually moving forward every day.  We also talked about a difficult conversation she had with her partner about what the relationship looks like.  Significant contradiction from 1 day to the next and what he presented and she  expressed some frustration with but at least was able to somewhat manage her emotions based on those conversations.  She is going to visit him July 3 through 8 but is considering cutting the visit short, she can be significant progress made in that time.  We processed what he said and how that impacted her and triggered some things which created significant feelings.  The patient is using her coping skills for dealing with the emotional response but also is doing some cognitive reframing and processing.  She also reports her anxiety has been up significantly over the past few weeks.  She feels that she could use something for anxiety specifically but is looking to change psychiatrists.  She is looking into several in the area to see who takes her insurance and who she can get in to see more quickly.  I encouraged her to do that.  She is medication compliant. The patient does contract for safety having no thoughts of hurting herself or anyone else. Target date: October 15, 2022. 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  The patient prefers to meet every other week via video session.  Anne Marks, [facility]  Anne Marks, Anne Marks, Anne Marks

## 2022-08-08 ENCOUNTER — Ambulatory Visit (INDEPENDENT_AMBULATORY_CARE_PROVIDER_SITE_OTHER): Payer: Commercial Managed Care - HMO | Admitting: Behavioral Health

## 2022-08-08 ENCOUNTER — Encounter: Payer: Self-pay | Admitting: Behavioral Health

## 2022-08-08 DIAGNOSIS — F3181 Bipolar II disorder: Secondary | ICD-10-CM | POA: Diagnosis not present

## 2022-08-08 NOTE — Progress Notes (Signed)
   [facility] Counselor/Therapist Progress Note  Patient ID: Anne Marks, MRN: [id number],    Date: 08/08/2022 Length of session: 46 minutes, 10:07 to 10:53 a.m. Time Spent: This session was held via video teletherapy. The patient consented to the video teletherapy and was located in her home during this session. She is aware it is the responsibility of the patient to secure confidentiality on her end of the session. The provider was in his outpatient therapy office for the duration of this session.      Treatment Type: Individual Therapy  Reported Symptoms: anxiety, depression  Mental Status Exam: Appearance:  Well Groomed     Behavior: Appropriate  Motor: Normal  Speech/Language:  Clear and Coherent  Affect: Appropriate  Mood: normal  Thought process: normal  Thought content:   WNL  Sensory/Perceptual disturbances:   WNL  Orientation: oriented to person, place, time/date, situation, day of week, month of year, and year  Attention: Good  Concentration: Good  Memory: WNL  Fund of knowledge:  Good  Insight:   Good  Judgment:  Good  Impulse Control: Good   Risk Assessment: Danger to Self:  No Self-injurious Behavior: No Danger to Others: No Duty to Warn:no Physical Aggression / Violence:No  Access to Firearms a concern: No  Gang Involvement:No   Subjective: The patient spoke with [facility] and is scheduled with a provider in about 3 weeks and is very thankful for that.  She feels that her meds are fairly stable right now.  She is taking some time starting Wednesday to go visit her partner through Sunday or Monday.  We did talk about some conversations in that relationship to her taking place in terms of what it looks like is going.  We also talked about relationships in the past and what she learned from the house and what led to changes in those relationships.  She is taking a closer look at what relationships looking for like to her.  We  talked about the progress that she had made in terms of looking forward to being in healthy relationships and things that she expects from relationships moving forward The patient does contract for safety having no thoughts of hurting herself or anyone else. Target date: October 15, 2022. 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  The patient prefers to meet every other week via video session.  [name], [facility]                                                                                                   [name], [facility]               [name], [facility]               [name], [facility]

## 2022-08-15 ENCOUNTER — Ambulatory Visit: Payer: Commercial Managed Care - HMO | Admitting: Behavioral Health

## 2022-08-29 ENCOUNTER — Encounter: Payer: Self-pay | Admitting: Adult Health

## 2022-08-29 ENCOUNTER — Ambulatory Visit (INDEPENDENT_AMBULATORY_CARE_PROVIDER_SITE_OTHER): Payer: Commercial Managed Care - HMO | Admitting: Behavioral Health

## 2022-08-29 ENCOUNTER — Ambulatory Visit (INDEPENDENT_AMBULATORY_CARE_PROVIDER_SITE_OTHER): Payer: BC Managed Care – PPO | Admitting: Adult Health

## 2022-08-29 ENCOUNTER — Encounter: Payer: Self-pay | Admitting: Behavioral Health

## 2022-08-29 VITALS — BP 122/84 | HR 98 | Ht 64.0 in | Wt 320.0 lb

## 2022-08-29 DIAGNOSIS — F900 Attention-deficit hyperactivity disorder, predominantly inattentive type: Secondary | ICD-10-CM | POA: Diagnosis not present

## 2022-08-29 DIAGNOSIS — F3181 Bipolar II disorder: Secondary | ICD-10-CM

## 2022-08-29 DIAGNOSIS — F411 Generalized anxiety disorder: Secondary | ICD-10-CM

## 2022-08-29 MED ORDER — LAMOTRIGINE 25 MG PO TABS: ORAL_TABLET | ORAL | 2 refills | Status: DC

## 2022-08-29 MED ORDER — BUPROPION HCL ER (XL) 150 MG PO TB24: 150.0000 mg | ORAL_TABLET | Freq: Every day | ORAL | 5 refills | Status: DC

## 2022-08-29 MED ORDER — ALPRAZOLAM 0.5 MG PO TABS: 0.5000 mg | ORAL_TABLET | Freq: Every day | ORAL | 2 refills | Status: DC | PRN

## 2022-08-29 MED ORDER — AMPHETAMINE-DEXTROAMPHETAMINE 10 MG PO TABS: 10.0000 mg | ORAL_TABLET | Freq: Two times a day (BID) | ORAL | 0 refills | Status: DC

## 2022-08-29 NOTE — Progress Notes (Signed)
[facility] MD/PA/NP Initial Note  08/29/2022 3:01 PM Anne Marks  MRN:  [id number]  Chief Complaint:   HPI:   Patient seen today for initial psychiatric evaluation.   Currently receiving treatment through a provider out of town and is looking for someone to manage medications locally.  Describes mood today as "ok". Pleasant. Denies tearfulness. Mood symptoms - reports depression - apathetic - "in a dip. Denies anxiety and irritability. Denies panic attacks. Denies worry and rumination. Reports over thinking and having cyclical thoughts. Reports mood as consistent. Stating "overall, I feel like I'm doing alright". Feels like current medication regimen works well. Stable interest and motivation. Taking medications as prescribed.  Energy levels stable. Active, does not have a regular exercise routine. Enjoys some usual interests and activities.  Lives with a rommate. Spending time with family and friends. Appetite adequate. Weight stable over the past year. Sleeps well most nights. Averages 4 hours. Focus and concentration stable. Diagnosed with ADD three years ago. Reports symptoms present since childhood. Completing tasks. Managing aspects of household. Working full time - therapist Denies SI or HI.  Denies AH or VH. Denies self harm. Denies substance use.  Previous medication trials: Buspar, Zoloft  Visit Diagnosis:    ICD-10-CM   1. Bipolar II disorder (HCC)  F31.81       Past Psychiatric History: Denies psychiatric hospitalization.   Past Medical History:  Past Medical History:  Diagnosis Date   Bipolar 1 disorder (HCC)    Former smoker    Mental disorder     Past Surgical History:  Procedure Laterality Date   NO PAST SURGERIES      Family Psychiatric History: Denies any family history of mental illness.   Family History: No family history on file.  Social History:  Social History   Socioeconomic History   Marital status: Single    Spouse name: Not on file    Number of children: Not on file   Years of education: Not on file   Highest education level: Not on file  Occupational History   Not on file  Tobacco Use   Smoking status: Former    Current packs/day: 0.00    Types: Cigarettes    Quit date: 10/09/2011    Years since quitting: 10.8   Smokeless tobacco: Never  Substance and Sexual Activity   Alcohol use: Yes    Comment: a weekedn a month ; not while pregnant   Drug use: No   Sexual activity: Yes    Comment: one week ago  Other Topics Concern   Not on file  Social History Narrative   Not on file   Social Determinants of Health   Financial Resource Strain: Low Risk  (02/11/2022)   Received from [facility]   Overall Financial Resource Strain (CARDIA)    Difficulty of Paying Living Expenses: Not very hard  Food Insecurity: Food Insecurity Present (02/11/2022)   Received from [facility]   Hunger Vital Sign    Worried About Running Out of Food in the Last Year: Sometimes true    Ran Out of Food in the Last Year: Patient declined  Transportation Needs: No Transportation Needs (02/11/2022)   Received from [facility] - Transportation    Lack of Transportation (Medical): No    Lack of Transportation (Non-Medical): No  Physical Activity: Insufficiently Active (02/11/2022)   Received from [facility]   Exercise Vital Sign    Days of Exercise per Week: 1 day  Minutes of Exercise per Session: 10 min  Stress: No Stress Concern Present (02/11/2022)   Received from [facility] - Occupational Stress Questionnaire    Feeling of Stress : Only a little  Social Connections: Socially Integrated (02/11/2022)   Received from [facility]   Social Network    How would you rate your social network (family, work, friends)?: Good participation with social networks    Allergies: No Known Allergies  Metabolic Disorder Labs: No results found for: "HGBA1C", "MPG" No results found for:  "PROLACTIN" No results found for: "CHOL", "TRIG", "HDL", "CHOLHDL", "VLDL", "LDLCALC" No results found for: "TSH"  Therapeutic Level Labs: No results found for: "LITHIUM" No results found for: "VALPROATE" No results found for: "CBMZ"  Current Medications: Current Outpatient Medications  Medication Sig Dispense Refill   ferrous sulfate 325 (65 FE) MG EC tablet Take 1 tablet (325 mg total) by mouth 2 (two) times daily. 60 tablet 3   ibuprofen (ADVIL,MOTRIN) 600 MG tablet Take 1 tablet (600 mg total) by mouth every 6 (six) hours. 30 tablet 0   Prenatal Vit-Fe Fumarate-FA (PRENATAL VITAMIN PO) Take by mouth.     No current facility-administered medications for this visit.    Medication Side Effects: none  Orders placed this visit:  No orders of the defined types were placed in this encounter.   Psychiatric Specialty Exam:  Review of Systems  Musculoskeletal:  Negative for gait problem.  Neurological:  Negative for tremors.  Psychiatric/Behavioral:         Please refer to HPI    unknown if currently breastfeeding.There is no height or weight on file to calculate BMI.  General Appearance: Casual and Neat  Eye Contact:  Good  Speech:  Clear and Coherent and Normal Rate  Volume:  Normal  Mood:  Euthymic  Affect:  Appropriate and Congruent  Thought Process:  Coherent and Descriptions of Associations: Intact  Orientation:  Full (Time, Place, and Person)  Thought Content: Logical   Suicidal Thoughts:  No  Homicidal Thoughts:  No  Memory:  WNL  Judgement:  Good  Insight:  Good  Psychomotor Activity:  Normal  Concentration:  Concentration: Good and Attention Span: Good  Recall:  Good  Fund of Knowledge: Good  Language: Good  Assets:  Communication Skills Desire for Improvement Financial Resources/Insurance Housing Intimacy Leisure Time Physical Health Resilience Social Support Talents/Skills Transportation Vocational/Educational  ADL's:  Intact  Cognition: WNL   Prognosis:  Good   Screenings:  PHQ2-9    Flowsheet Row US OB +14 ALL [facility] 08/25/2014 in [facility]  PHQ-2 Total Score 0 0       Receiving Psychotherapy: Yes   Treatment Plan/Recommendations:  Plan:  PDMP reviewed  Xanax 0.5mg  daily Adderall 10mg  BID Wellbutrin XL 150mg  every morning Lamictal 150mg  daily  Prazosin 1mg  capsule - 1 to 2 at bedtime as needed Trazadone 50mg  - 1 to 2 at hs as needed  RTC 4 weeks  Patient advised to contact office with any questions, adverse effects, or acute worsening in signs and symptoms.   Discussed potential benefits, risk, and side effects of benzodiazepines to include potential risk of tolerance and dependence, as well as possible drowsiness.  Advised patient not to drive if experiencing drowsiness and to take lowest possible effective dose to minimize risk of dependence and tolerance.      Dorothyann Gibbs, NP

## 2022-08-29 NOTE — Progress Notes (Signed)
   [facility] Counselor/Therapist Progress Note  Patient ID: Anne Marks, MRN: [id number],    Date: 08/29/2022 Length of session: 1002 to 10:56 AM, 54 minutes Time Spent: This session was held via video teletherapy. The patient consented to the video teletherapy and was located in her home during this session. She is aware it is the responsibility of the patient to secure confidentiality on her end of the session. The provider was in his outpatient therapy office for the duration of this session.      Treatment Type: Individual Therapy  Reported Symptoms: anxiety, depression  Mental Status Exam: Appearance:  Well Groomed     Behavior: Appropriate  Motor: Normal  Speech/Language:  Clear and Coherent  Affect: Appropriate  Mood: normal  Thought process: normal  Thought content:   WNL  Sensory/Perceptual disturbances:   WNL  Orientation: oriented to person, place, time/date, situation, day of week, month of year, and year  Attention: Good  Concentration: Good  Memory: WNL  Fund of knowledge:  Good  Insight:   Good  Judgment:  Good  Impulse Control: Good   Risk Assessment: Danger to Self:  No Self-injurious Behavior: No Danger to Others: No Duty to Warn:no Physical Aggression / Violence:No  Access to Firearms a concern: No  Gang Involvement:No   Subjective: The patient had dental issue addressed today so she was feeling orally.  Otherwise things have gone fairly well.  She spent 4 July weekend with her partner as well as some time with friends.  She reports some good intentional conversation and also her partner spending time with her friends which she was pleased with.  For the most part she is keeping a close eye on her father in the early stages of Parkinson's and feels that he is doing as well as she can.  She feels that she has set some firm limits with her work and is in a healthier place in terms of boundaries there.  She is meeting with a new psychiatrist  this afternoon and feels that basically she is in a good place and just needs someone to manage her medications.  Sleep is off but she also has been out of her sleep medication for several weeks which was working well and he feels that the psychiatrist will renew those also. Target date: October 15, 2022. 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  The patient prefers to meet every other week via video session.  [name], [facility]

## 2022-09-12 ENCOUNTER — Encounter: Payer: Self-pay | Admitting: Behavioral Health

## 2022-09-12 ENCOUNTER — Ambulatory Visit: Payer: Commercial Managed Care - HMO | Admitting: Behavioral Health

## 2022-09-12 DIAGNOSIS — F3181 Bipolar II disorder: Secondary | ICD-10-CM

## 2022-09-12 DIAGNOSIS — F319 Bipolar disorder, unspecified: Secondary | ICD-10-CM

## 2022-09-12 NOTE — Progress Notes (Signed)
[facility] Counselor/Therapist Progress Note  Patient ID: OLAR WIRSING, MRN: [id number],    Date: 09/12/2022 Length of session: 1002 to 10:58 AM, 56 minutes Time Spent: This session was held via video teletherapy. The patient consented to the video teletherapy and was located in her home during this session. She is aware it is the responsibility of the patient to secure confidentiality on her end of the session. The provider was in his outpatient therapy office for the duration of this session.      Treatment Type: Individual Therapy  Reported Symptoms: anxiety, depression  Mental Status Exam: Appearance:  Well Groomed     Behavior: Appropriate  Motor: Normal  Speech/Language:  Clear and Coherent  Affect: Appropriate  Mood: normal  Thought process: normal  Thought content:   WNL  Sensory/Perceptual disturbances:   WNL  Orientation: oriented to person, place, time/date, situation, day of week, month of year, and year  Attention: Good  Concentration: Good  Memory: WNL  Fund of knowledge:  Good  Insight:   Good  Judgment:  Good  Impulse Control: Good   Risk Assessment: Danger to Self:  No Self-injurious Behavior: No Danger to Others: No Duty to Warn:no Physical Aggression / Violence:No  Access to Firearms a concern: No  Gang Involvement:No   Subjective: The patient went to [place] for [employer].  Her best friend since middle school with her and she got her partner as well as some good friends who live in the [place] area.  She is described as okay.  It typically is her favorite events to attend issues with her partner took away some of the enjoyment.  She recognizes that there has been a lot of growth over the years but there are still times when she recognizes that she would like to get into the temptation to be angry and thinks over somebody's head that she remembers from the past.  She is being very mindful and not acting on those but does not like the fact  that it feels like at times there is a fireball inside of her wants to get out.  We began to talk about why she feels like she wants to hold a grudge or to verbally go off on someone if they have wronged her even if it is now time.  She recognizes part of it is rooted in trust and also some movement and she has been treated over the years.  We will process that word in the next session.  I encouraged her to think about her why that feeling still lingers so that we can move forward.  She does contract for safety having no thoughts of hurting herself or anyone else.  Target date: October 15, 2022. Interventions:  Cognitive Behavioral Therapy Diagnosis:Bi-Polar d/O Progress: 35% Plan: fe/U appointments scheduled.  The patient prefers to meet every other week via video session.  [name], [facility]  [name], [facility]

## 2022-09-26 ENCOUNTER — Ambulatory Visit (INDEPENDENT_AMBULATORY_CARE_PROVIDER_SITE_OTHER): Payer: Commercial Managed Care - HMO | Admitting: Adult Health

## 2022-09-26 ENCOUNTER — Ambulatory Visit: Payer: Commercial Managed Care - HMO | Admitting: Behavioral Health

## 2022-09-26 ENCOUNTER — Encounter: Payer: Self-pay | Admitting: Adult Health

## 2022-09-26 ENCOUNTER — Encounter: Payer: Self-pay | Admitting: Behavioral Health

## 2022-09-26 DIAGNOSIS — F411 Generalized anxiety disorder: Secondary | ICD-10-CM | POA: Diagnosis not present

## 2022-09-26 DIAGNOSIS — F900 Attention-deficit hyperactivity disorder, predominantly inattentive type: Secondary | ICD-10-CM | POA: Diagnosis not present

## 2022-09-26 DIAGNOSIS — F319 Bipolar disorder, unspecified: Secondary | ICD-10-CM | POA: Diagnosis not present

## 2022-09-26 DIAGNOSIS — F3181 Bipolar II disorder: Secondary | ICD-10-CM

## 2022-09-26 DIAGNOSIS — F4310 Post-traumatic stress disorder, unspecified: Secondary | ICD-10-CM

## 2022-09-26 MED ORDER — LAMOTRIGINE 150 MG PO TABS: 150.0000 mg | ORAL_TABLET | Freq: Every day | ORAL | 5 refills | Status: DC

## 2022-09-26 MED ORDER — LAMOTRIGINE 100 MG PO TABS: 100.0000 mg | ORAL_TABLET | Freq: Every day | ORAL | 0 refills | Status: DC

## 2022-09-26 NOTE — Progress Notes (Signed)
[facility] Counselor/Therapist Progress Note  Patient ID: Anne Marks, MRN: [id number],    Date: 09/26/2022 Length of session: 1002 to 10:58 AM, 56 minutes Time Spent: This session was held via video teletherapy. The patient consented to the video teletherapy and was located in her home during this session. She is aware it is the responsibility of the patient to secure confidentiality on her end of the session. The provider was in his outpatient therapy office for the duration of this session.      Treatment Type: Individual Therapy  Reported Symptoms: anxiety, depression  Mental Status Exam: Appearance:  Well Groomed     Behavior: Appropriate  Motor: Normal  Speech/Language:  Clear and Coherent  Affect: Appropriate  Mood: normal  Thought process: normal  Thought content:   WNL  Sensory/Perceptual disturbances:   WNL  Orientation: oriented to person, place, time/date, situation, day of week, month of year, and year  Attention: Good  Concentration: Good  Memory: WNL  Fund of knowledge:  Good  Insight:   Good  Judgment:  Good  Impulse Control: Good   Risk Assessment: Danger to Self:  No Self-injurious Behavior: No Danger to Others: No Duty to Warn:no Physical Aggression / Violence:No  Access to Firearms a concern: No  Gang Involvement:No   Subjective: The patient spent the weekend with her biological daughter and had a great time.  She was around the patient's parents and extended family and everyone was very supportive and encouraging.  She had some great conversations with her 30-year-old daughter and says it is fun watching how much she is changing and growing.  She is 100% sure that she made the right decision to have her daughter adopted and still be a part of her life but it did lead to her thinking about her future in terms of having children.  She knows that she made the decision to adopt when she was 23 so that her daughter could have the best and the  patient could start to get her best life going.  She has been having extended conversations with biological father of her child and they have had a lot of conversation about why they did not work.  Basically they stopped seeing each other because they did not know how to have a child together or not ready to do so.  She is frustrated with him because she knows that he is not living up to his potential but is not sure why it did not work she feels the need to think about his future if he does not see the same path for him that she does.  We will pick up that conversation in the next session.  The patient has cut out her Saturday work and that made a huge difference for her.  She is spending time with friends and is practicing as good self-care as time allows.  She feels that her mood is fairly stable. She does contract for safety having no thoughts of hurting herself or anyone else.  Target date: October 15, 2022. Interventions:  Cognitive Behavioral Therapy Diagnosis:Bi-Polar d/O Progress: 30% Plan: fe/U appointments scheduled.  The patient prefers to meet every other week via video session.  [name], [facility]  [name], [facility]               [name], [facility]

## 2022-09-26 NOTE — Progress Notes (Signed)
Anne Marks [id number] [birth year] 32 y.o.  Subjective:   Patient ID:  Anne Marks is a 32 y.o. (DOB 1990/11/05) adult.  Chief Complaint: No chief complaint on file.   HPI GURLEEN JEZIORSKI presents to the office today for follow-up of ADD, BPD-2, GAD, and PTSD.Marland Kitchen   Describes mood today as "ok". Pleasant. Denies tearfulness. Mood symptoms - reports depression - lethargy and apathetic. Denies anxiety. Reports higher irritability. Denies panic attacks. Denies worry. Reports over thinking and rumination. Denies cyclical thinking. Reports mood as consistent. Stating "I feel like I'm doing alright". Feels like current medication regimen works well. Stable interest and motivation. Taking medications as prescribed.  Energy levels stable. Active, does not have a regular exercise routine. Enjoys some usual interests and activities. Lives with a rommate. Spending time with family and friends. Appetite adequate. Weight stable. Reports sleeping difficulties. Averages 4 hours. Focus and concentration stable. Diagnosed with ADD three years ago. Reports symptoms present since childhood. Completing tasks. Managing aspects of household. Working full time - therapist Denies SI or HI.  Denies AH or VH. Denies self harm. Denies substance use. Working with a Paramedic.  Previous medication trials: Buspar, Zoloft  PHQ2-9    Flowsheet Row US OB +14 ALL [facility]/2014 [facility]  PHQ-2 Total Score 0 0        Review of Systems:  Review of Systems  Musculoskeletal:  Negative for gait problem.  Neurological:  Negative for tremors.  Psychiatric/Behavioral:         Please refer to HPI    Medications: I have reviewed the patient's current medications.  Current Outpatient Medications  Medication Sig Dispense Refill   lamoTRIgine (LAMICTAL) 100 MG tablet Take 1 tablet (100 mg total) by mouth  daily. 7 tablet 0   lamoTRIgine (LAMICTAL) 150 MG tablet Take 1 tablet (150 mg total) by mouth daily. 30 tablet 5   ALPRAZolam (XANAX) 0.5 MG tablet Take 1 tablet (0.5 mg total) by mouth daily as needed for anxiety. 30 tablet 2   amphetamine-dextroamphetamine (ADDERALL) 10 MG tablet Take 1 tablet (10 mg total) by mouth 2 (two) times daily. 60 tablet 0   buPROPion (WELLBUTRIN XL) 150 MG 24 hr tablet Take 1 tablet (150 mg total) by mouth daily. 30 tablet 5   ferrous sulfate 325 (65 FE) MG EC tablet Take 1 tablet (325 mg total) by mouth 2 (two) times daily. 60 tablet 3   ibuprofen (ADVIL,MOTRIN) 600 MG tablet Take 1 tablet (600 mg total) by mouth every 6 (six) hours. 30 tablet 0   Prenatal Vit-Fe Fumarate-FA (PRENATAL VITAMIN PO) Take by mouth.     No current facility-administered medications for this visit.    Medication Side Effects: None  Allergies: No Known Allergies  Past Medical History:  Diagnosis Date   Bipolar 1 disorder (HCC)    Former smoker    Mental disorder     Past Medical History, Surgical history, Social history, and Family history were reviewed and updated as appropriate.   Please see review of systems for further details on the patient's review from today.   Objective:   Physical Exam:  There were no vitals taken for this visit.  Physical Exam Constitutional:      General: Heaven is not in acute distress. Musculoskeletal:        General: No deformity.  Neurological:     Mental Status: Berline is alert  and oriented to person, place, and time.     Coordination: Coordination normal.  Psychiatric:        Attention and Perception: Attention and perception normal. Markay does not perceive auditory or visual hallucinations.        Mood and Affect: Affect is not labile, blunt, angry or inappropriate.        Speech: Speech normal.        Behavior: Behavior normal.        Thought Content: Thought content normal. Thought content is not paranoid or delusional. Thought  content does not include homicidal or suicidal ideation. Thought content does not include homicidal or suicidal plan.        Cognition and Memory: Cognition and memory normal.        Judgment: Judgment normal.     Comments: Insight intact     Lab Review:     Component Value Date/Time   NA 140 07/18/2017 1010   K 4.3 07/18/2017 1010   CL 106 07/18/2017 1010   CO2 26 07/18/2017 1010   GLUCOSE 111 (H) 07/18/2017 1010   BUN 9 07/18/2017 1010   CREATININE 0.71 07/18/2017 1010   CALCIUM 9.4 07/18/2017 1010   PROT 7.6 07/18/2017 1010   ALBUMIN 3.5 07/18/2017 1010   AST 14 (L) 07/18/2017 1010   ALT 19 07/18/2017 1010   ALKPHOS 84 07/18/2017 1010   BILITOT 0.5 07/18/2017 1010   GFRNONAA >60 07/18/2017 1010   GFRAA >60 07/18/2017 1010       Component Value Date/Time   WBC 9.7 07/18/2017 1010   RBC 4.51 07/18/2017 1010   HGB 12.0 07/18/2017 1010   HCT 38.5 07/18/2017 1010   PLT 459 (H) 07/18/2017 1010   MCV 85.4 07/18/2017 1010   MCH 26.6 07/18/2017 1010   MCHC 31.2 07/18/2017 1010   RDW 14.3 07/18/2017 1010   LYMPHSABS 2.0 07/18/2017 1010   MONOABS 0.5 07/18/2017 1010   EOSABS 0.1 07/18/2017 1010   BASOSABS 0.0 07/18/2017 1010    No results found for: "POCLITH", "LITHIUM"   No results found for: "PHENYTOIN", "PHENOBARB", "VALPROATE", "CBMZ"   .res Assessment: Plan:    Plan:  PDMP reviewed  Xanax 0.5mg  daily Adderall 10mg  BID Wellbutrin XL 150mg  every morning Lamictal 50mg  daily  Prazosin 1mg  capsule - 1 to 2 at bedtime as needed Trazadone 50mg  - 1 to 2 at hs as needed  RTC 3 months  Patient advised to contact office with any questions, adverse effects, or acute worsening in signs and symptoms.   Discussed potential benefits, risk, and side effects of benzodiazepines to include potential risk of tolerance and dependence, as well as possible drowsiness.  Advised patient not to drive if experiencing drowsiness and to take lowest possible effective dose to  minimize risk of dependence and tolerance.   Diagnoses and all orders for this visit:  Bipolar II disorder (HCC) -     lamoTRIgine (LAMICTAL) 100 MG tablet; Take 1 tablet (100 mg total) by mouth daily. -     lamoTRIgine (LAMICTAL) 150 MG tablet; Take 1 tablet (150 mg total) by mouth daily.  Generalized anxiety disorder  Attention deficit hyperactivity disorder (ADHD), predominantly inattentive type  PTSD (post-traumatic stress disorder)     Please see After Visit Summary for patient specific instructions.  Future Appointments  Date Time Provider Department Center  10/12/2022 10:00 AM [name], [facility] None  10/24/2022 10:00 AM [name], [facility] None  11/07/2022 10:00 AM Serafina Mitchell  Judie Petit, [facility] None  11/21/2022 10:00 AM [name], [facility] None  12/05/2022 10:00 AM [name], [facility] None    No orders of the defined types were placed in this encounter.   -------------------------------

## 2022-10-12 ENCOUNTER — Ambulatory Visit (INDEPENDENT_AMBULATORY_CARE_PROVIDER_SITE_OTHER): Payer: Commercial Managed Care - HMO | Admitting: Behavioral Health

## 2022-10-12 ENCOUNTER — Encounter: Payer: Self-pay | Admitting: Behavioral Health

## 2022-10-12 DIAGNOSIS — F3181 Bipolar II disorder: Secondary | ICD-10-CM | POA: Diagnosis not present

## 2022-10-12 NOTE — Progress Notes (Signed)
[facility] Counselor/Therapist Progress Note  Patient ID: JEANNA MERRYFIELD, MRN: [id number],    Date: 10/12/2022 Length of session: 1005 to 10:59 AM, 54 minutes Time Spent: This session was held via video teletherapy. The patient consented to the video teletherapy and was located in her home during this session. She is aware it is the responsibility of the patient to secure confidentiality on her end of the session. The provider was in his outpatient therapy office for the duration of this session.      Treatment Type: Individual Therapy  Reported Symptoms: anxiety, depression  Mental Status Exam: Appearance:  Well Groomed     Behavior: Appropriate  Motor: Normal  Speech/Language:  Clear and Coherent  Affect: Appropriate  Mood: normal  Thought process: normal  Thought content:   WNL  Sensory/Perceptual disturbances:   WNL  Orientation: oriented to person, place, time/date, situation, day of week, month of year, and year  Attention: Good  Concentration: Good  Memory: WNL  Fund of knowledge:  Good  Insight:   Good  Judgment:  Good  Impulse Control: Good   Risk Assessment: Danger to Self:  No Self-injurious Behavior: No Danger to Others: No Duty to Warn:no Physical Aggression / Violence:No  Access to Firearms a concern: No  Gang Involvement:No   Subjective: The patient's grandmother died unexpectedly last week.  The funeral services today.  She was not particularly close to her grandmother and although she is sad she does not feel extreme grief.  She is more anxious about family members and her interaction with them.  When they are together they are always asking when she does not come to their home area anymore than she does and that she ought to come more.  She is concerned about how they might grieve as opposed to their perception of how she is grieving.  We talked about ways that she can set healthy boundaries both verbally and emotionally with them in today  situation but overall how she can do so with family members that she does not necessarily have a strong relationship with.  She has no desire to move back to the area where most of her family lives.  We talked about ways that today could be more comfortable for her.  We also processed conversations that she has been having with different people in her life and what those look like based on who they are, their history goals directives etc. for the patient and for those people.  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  She does contract for safety having no thoughts of hurting herself or anyone else.  Target date: October 15, 2022. Interventions:  Cognitive Behavioral Therapy Diagnosis:Bi-Polar d/O Progress: 35% Plan: fe/U appointments scheduled.  The patient prefers to meet every other week via video session.  [name], [facility]  [name], [facility]               [name], [facility]               [name], [facility]

## 2022-10-24 ENCOUNTER — Ambulatory Visit (INDEPENDENT_AMBULATORY_CARE_PROVIDER_SITE_OTHER): Payer: Commercial Managed Care - HMO | Admitting: Behavioral Health

## 2022-10-24 ENCOUNTER — Encounter: Payer: Self-pay | Admitting: Behavioral Health

## 2022-10-24 DIAGNOSIS — F319 Bipolar disorder, unspecified: Secondary | ICD-10-CM

## 2022-10-24 DIAGNOSIS — F3181 Bipolar II disorder: Secondary | ICD-10-CM

## 2022-10-24 NOTE — Progress Notes (Addendum)
[facility] Counselor/Therapist Progress Note  Patient ID: Anne Marks, MRN: [id number],    Date: 10/24/2022 Length of session: 10:00 to 10:57 AM, 57 minutes Time Spent: This session was held via video teletherapy. The patient consented to the video teletherapy and was located in her home during this session. She is aware it is the responsibility of the patient to secure confidentiality on her end of the session. The provider was in his outpatient therapy office for the duration of this session.      Treatment Type: Individual Therapy  Reported Symptoms: anxiety, depression  Mental Status Exam: Appearance:  Well Groomed     Behavior: Appropriate  Motor: Normal  Speech/Language:  Clear and Coherent  Affect: Appropriate  Mood: normal  Thought process: normal  Thought content:   WNL  Sensory/Perceptual disturbances:   WNL  Orientation: oriented to person, place, time/date, situation, day of week, month of year, and year  Attention: Good  Concentration: Good  Memory: WNL  Fund of knowledge:  Good  Insight:   Good  Judgment:  Good  Impulse Control: Good   Risk Assessment: Danger to Self:  No Self-injurious Behavior: No Danger to Others: No Duty to Warn:no Physical Aggression / Violence:No  Access to Firearms a concern: No  Gang Involvement:No   Subjective: The patient's grandmother today fairly well.  Some mild family issues came up and the patient handled them well.  She was frustrated this morning because the relatively new car that she bought recently would not start yesterday when she went to visit her parents.  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  The patient did have some strong feelings associated with having a court 3 months and already not working.  We talked about an assertive response.  We also processed some of the things that she has been thinking about in terms of her relationship and what that looks like long-term.   She is visiting her partner this weekend and there are things that she wants to discuss with him about what they look like.  The patient has very thoroughly and has organized thoughts.  She also had conversations with someone else in her life and set some very clear boundaries. She does contract for safety having no thoughts of hurting herself or anyone else.  Target date: October 15, 2022.  I reviewed the treatment goals with the patient and she agreed to continue to with them as stated with an extended target date of May 08, 2023. Interventions:  Cognitive Behavioral Therapy Diagnosis:Bi-Polar d/O Progress: 35% Plan: few/U appointments scheduled.  The patient prefers to meet every other week via video session.  Anne Marks, [facility]                                                                                                                   Anne Marks, [facility]               Anne Marks  Anne Marks Anne Marks               Anne Marks, [facility]               Anne Marks, [facility]

## 2022-11-07 ENCOUNTER — Encounter: Payer: Self-pay | Admitting: Behavioral Health

## 2022-11-07 ENCOUNTER — Ambulatory Visit (INDEPENDENT_AMBULATORY_CARE_PROVIDER_SITE_OTHER): Payer: 59 | Admitting: Behavioral Health

## 2022-11-07 DIAGNOSIS — F3181 Bipolar II disorder: Secondary | ICD-10-CM

## 2022-11-07 DIAGNOSIS — F319 Bipolar disorder, unspecified: Secondary | ICD-10-CM

## 2022-11-07 NOTE — Progress Notes (Signed)
[facility] Counselor/Therapist Progress Note  Patient ID: Anne Marks, MRN: [id number],    Date: 11/07/2022 Length of session: 10:00 to 10:55 AM, 55 minutes Time Spent: This session was held via video teletherapy. The patient consented to the video teletherapy and was located in her home during this session. She is aware it is the responsibility of the patient to secure confidentiality on her end of the session. The provider was in his outpatient therapy office for the duration of this session.      Treatment Type: Individual Therapy  Reported Symptoms: anxiety, depression  Mental Status Exam: Appearance:  Well Groomed     Behavior: Appropriate  Motor: Normal  Speech/Language:  Clear and Coherent  Affect: Appropriate  Mood: normal  Thought process: normal  Thought content:   WNL  Sensory/Perceptual disturbances:   WNL  Orientation: oriented to person, place, time/date, situation, day of week, month of year, and year  Attention: Good  Concentration: Good  Memory: WNL  Fund of knowledge:  Good  Insight:   Good  Judgment:  Good  Impulse Control: Good   Risk Assessment: Danger to Self:  No Self-injurious Behavior: No Danger to Others: No Duty to Warn:no Physical Aggression / Violence:No  Access to Firearms a concern: No  Gang Involvement:No   Subjective: A recent conversation with a former friend of the patient's led to her taking a closer look at how she sees her role in relationships both personally and professionally.  In this case the former friend had made some poor decisions in relationships in the patient and tried to help her see the consequences of poor choices but the friend did not listen.  The friend has gotten out of their relationship but the patient says she has not changed the way she approaches things and is awaiting how much she wants to be invested there.  We compared and contrasted a professional relationship that she has and how she  approaches that in the position that she holds.  We talked about how she is seeing is her role with him in his relationships and what her experiences in them.  She wrestles with what she should do versus what she wants to do but try to plan the day and with the importance of self-care.  She did meet with her supervisor gave her permission to refer the professional relationship in a different direction if that was what was best for her and mentioned he is concerned about compassion fatigue which she is aware of and talked about how she can continue to look at ways to care for herself including scheduling.  She does contract for safety having no thoughts of hurting herself or anyone else.  Target date: October 15, 2022.  I reviewed the treatment goals with the patient and she agreed to continue to with them as stated with an extended target date of May 08, 2023. Interventions:  Cognitive Behavioral Therapy Diagnosis:Bi-Polar d/O Progress: 35% Plan: few/U appointments scheduled.  The patient prefers to meet every other week via video session.  [name], [facility]  [name], [facility]               [name], [facility]               [name], [facility]               [name], [facility]               [name], [facility]

## 2022-11-21 ENCOUNTER — Ambulatory Visit (INDEPENDENT_AMBULATORY_CARE_PROVIDER_SITE_OTHER): Payer: Commercial Managed Care - HMO | Admitting: Behavioral Health

## 2022-11-21 ENCOUNTER — Encounter: Payer: Self-pay | Admitting: Behavioral Health

## 2022-11-21 DIAGNOSIS — F319 Bipolar disorder, unspecified: Secondary | ICD-10-CM

## 2022-11-21 DIAGNOSIS — F3181 Bipolar II disorder: Secondary | ICD-10-CM

## 2022-11-21 NOTE — Progress Notes (Signed)
[facility] Counselor/Therapist Progress Note  Patient ID: GUENEVERE ROORDA, MRN: [id number],    Date: 11/21/2022 Length of session: 10:00 to 10:57 AM, 57 minutes Time Spent: This session was held via video teletherapy. The patient consented to the video teletherapy and was located in her home during this session. She is aware it is the responsibility of the patient to secure confidentiality on her end of the session. The provider was in his outpatient therapy office for the duration of this session.      Treatment Type: Individual Therapy  Reported Symptoms: anxiety, depression  Mental Status Exam: Appearance:  Well Groomed     Behavior: Appropriate  Motor: Normal  Speech/Language:  Clear and Coherent  Affect: Appropriate  Mood: normal  Thought process: normal  Thought content:   WNL  Sensory/Perceptual disturbances:   WNL  Orientation: oriented to person, place, time/date, situation, day of week, month of year, and year  Attention: Good  Concentration: Good  Memory: WNL  Fund of knowledge:  Good  Insight:   Good  Judgment:  Good  Impulse Control: Good   Risk Assessment: Danger to Self:  No Self-injurious Behavior: No Danger to Others: No Duty to Warn:no Physical Aggression / Violence:No  Access to Firearms a concern: No  Gang Involvement:No   Subjective: Recent news that the patient's oldest sister will be moving back to her hometown led to some introspection for the patient.  The patient has 3 older siblings and in some way each of the 3 of them have created distress for her parents over the years even into adulthood.  She believes that her sister moving back home may not necessarily have a positive impact on family dynamics.  The patient is 5 years younger than her next older sister and says that for the most part the patient was left to her own devices a lot.  She knows that as an explanation her parents had their hands full with her siblings and that has  created some fairly strong feelings over the years.  The patient has succeeded in so many ways but a lot of times had to figure it out on her own and now struggles with asking for help.  Not just with her parents but with other people in her life.  We processed the feelings and thoughts behind that and talked about how to start to change that gradually to allow herself to be cared for at various levels.    To begin the next session she would like to look at a specific relationship in her life at the direction that is going.  She does contract for safety having no thoughts of hurting herself or anyone else.  Target date: October 15, 2022.  I reviewed the treatment goals with the patient and she agreed to continue to with them as stated with an extended target date of May 08, 2023. 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  The patient prefers to meet every other week via video session.  [name], [facility]  [name], [facility]               [name], [facility]               [name], [facility]               [name], [facility]               [name], [facility]               [name], [facility]

## 2022-12-05 ENCOUNTER — Encounter: Payer: Self-pay | Admitting: Behavioral Health

## 2022-12-05 ENCOUNTER — Ambulatory Visit (INDEPENDENT_AMBULATORY_CARE_PROVIDER_SITE_OTHER): Payer: 59 | Admitting: Behavioral Health

## 2022-12-05 DIAGNOSIS — F319 Bipolar disorder, unspecified: Secondary | ICD-10-CM | POA: Diagnosis not present

## 2022-12-05 DIAGNOSIS — F3181 Bipolar II disorder: Secondary | ICD-10-CM

## 2022-12-05 NOTE — Progress Notes (Signed)
[facility] Counselor/Therapist Progress Note  Patient ID: Anne Marks, MRN: [id number],    Date: 12/05/2022 Length of session: 10:00 to 10:57 AM, 57 minutes Time Spent: This session was held via video teletherapy. The patient consented to the video teletherapy and was located in her home during this session. She is aware it is the responsibility of the patient to secure confidentiality on her end of the session. The provider was in his outpatient therapy office for the duration of this session.      Treatment Type: Individual Therapy  Reported Symptoms: anxiety, depression  Mental Status Exam: Appearance:  Well Groomed     Behavior: Appropriate  Motor: Normal  Speech/Language:  Clear and Coherent  Affect: Appropriate  Mood: normal  Thought process: normal  Thought content:   WNL  Sensory/Perceptual disturbances:   WNL  Orientation: oriented to person, place, time/date, situation, day of week, month of year, and year  Attention: Good  Concentration: Good  Memory: WNL  Fund of knowledge:  Good  Insight:   Good  Judgment:  Good  Impulse Control: Good   Risk Assessment: Danger to Self:  No Self-injurious Behavior: No Danger to Others: No Duty to Warn:no Physical Aggression / Violence:No  Access to Firearms a concern: No  Gang Involvement:No   Subjective: The patient said that with 3 things she wanted to look at specifically today including relationship with her partner, relationship with the father of her child and her upcoming move this weekend.  She and her partner have had multiple conversations she does not understand what he is looking for.  He has been very clear to articulate what he does not feel she is giving but is not necessarily very specific about is confusing for the patient.  She feels at times he wants to give up on the relationship but she does not want to do that because of certain things that need to be worked on.  She is talking with the  father of her daughter regularly and although she is enjoying the conversation she does not feel that it is necessarily something that would be other than friends at this point.  She values what they used to have talked about what it is that relationship if felt good primarily that she was cared for and he paid attention and noticed.  She feels like she always gets that from her current partner.  She is moving to a different apartment in the same complex and is excited to have an office that she can work from.  Anxiety comes the preparation that she is very busy and knows she has a lot of back up.  Thankfully moving company to move the big things so that reduces some of the stress.  We talked about compartmentalizing so that it does not feel so overwhelming.  She does contract for safety having no thoughts of hurting herself or anyone else.  Target date: October 15, 2022.  I reviewed the treatment goals with the patient and she agreed to continue to with them as stated with an extended target date of May 08, 2023. 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  The patient prefers to meet every other week via video session.  [name], [facility]  [name], [facility]               [name], [facility]               [name], [facility]               [name], [facility]               [name], [facility]               [name], [facility]               [name], [facility]

## 2022-12-19 ENCOUNTER — Encounter: Payer: Self-pay | Admitting: Behavioral Health

## 2022-12-19 ENCOUNTER — Ambulatory Visit: Payer: BC Managed Care – PPO | Admitting: Behavioral Health

## 2022-12-19 DIAGNOSIS — F319 Bipolar disorder, unspecified: Secondary | ICD-10-CM

## 2022-12-19 DIAGNOSIS — F3181 Bipolar II disorder: Secondary | ICD-10-CM

## 2022-12-19 NOTE — Progress Notes (Signed)
[facility] Counselor/Therapist Progress Note  Patient ID: VADA LARTIGUE, MRN: [id number],    Date: 12/19/2022 Length of session: 10:00 to 10:54 AM, 54 minutes Time Spent: This session was held via video teletherapy. The patient consented to the video teletherapy and was located in her home during this session. She is aware it is the responsibility of the patient to secure confidentiality on her end of the session. The provider was in his outpatient therapy office for the duration of this session.      Treatment Type: Individual Therapy  Reported Symptoms: anxiety, depression  Mental Status Exam: Appearance:  Well Groomed     Behavior: Appropriate  Motor: Normal  Speech/Language:  Clear and Coherent  Affect: Appropriate  Mood: normal  Thought process: normal  Thought content:   WNL  Sensory/Perceptual disturbances:   WNL  Orientation: oriented to person, place, time/date, situation, day of week, month of year, and year  Attention: Good  Concentration: Good  Memory: WNL  Fund of knowledge:  Good  Insight:   Good  Judgment:  Good  Impulse Control: Good   Risk Assessment: Danger to Self:  No Self-injurious Behavior: No Danger to Others: No Duty to Warn:no Physical Aggression / Violence:No  Access to Firearms a concern: No  Gang Involvement:No   Subjective: The patient drove to [place] to surprise her partner for his birthday and said that went very well and she feels that he to reconnect the 2 of them in a very positive way and she is thankful for that.  She and her roommate also moved although they are still getting settled she is enjoying having a distinct office space as well as a larger apartment.  She had some concerns about the election passively but especially in regard to conversations with the father of her child not understanding his apathy.  As part she seems to be fine and clearly in relationships and setting healthy boundaries.  Work is going well  and she is setting healthy boundaries there also.  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 She does contract for safety having no thoughts of hurting herself or anyone else.  Target date: October 15, 2022.  I reviewed the treatment goals with the patient and she agreed to continue to with them as stated with an extended target date of May 08, 2023. 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  The patient prefers to meet every other week via video session.  [name], [facility]                                                                                                                   [name], [facility]               [name], [facility]               [name], [facility]  [name], [facility]               [name], [facility]               [name], [facility]               [name], [facility]               [name], [facility]

## 2022-12-26 ENCOUNTER — Encounter: Payer: Self-pay | Admitting: Behavioral Health

## 2022-12-26 ENCOUNTER — Ambulatory Visit (INDEPENDENT_AMBULATORY_CARE_PROVIDER_SITE_OTHER): Payer: BC Managed Care – PPO | Admitting: Behavioral Health

## 2022-12-26 DIAGNOSIS — F3181 Bipolar II disorder: Secondary | ICD-10-CM | POA: Diagnosis not present

## 2022-12-26 NOTE — Progress Notes (Signed)
[facility] Counselor/Therapist Progress Note  Patient ID: Anne Marks, MRN: [id number],    Date: 12/26/2022 Length of session: 10:00 to 10:57 AM, 57 minutes Time Spent: This session was held via video teletherapy. The patient consented to the video teletherapy and was located in her home during this session. She is aware it is the responsibility of the patient to secure confidentiality on her end of the session. The provider was in his outpatient therapy office for the duration of this session.      Treatment Type: Individual Therapy  Reported Symptoms: anxiety, depression  Mental Status Exam: Appearance:  Well Groomed     Behavior: Appropriate  Motor: Normal  Speech/Language:  Clear and Coherent  Affect: Appropriate  Mood: normal  Thought process: normal  Thought content:   WNL  Sensory/Perceptual disturbances:   WNL  Orientation: oriented to person, place, time/date, situation, day of week, month of year, and year  Attention: Good  Concentration: Good  Memory: WNL  Fund of knowledge:  Good  Insight:   Good  Judgment:  Good  Impulse Control: Good   Risk Assessment: Danger to Self:  No Self-injurious Behavior: No Danger to Others: No Duty to Warn:no Physical Aggression / Violence:No  Access to Firearms a concern: No  Gang Involvement:No   Subjective: Finding out that a former classmate progress school at got engaged over the weekend led to the patient having some thoughts about marriage and a conversation with her friend about marriage.  She has always valued marriage and wants to be married.  She said that even as a young girl she planned out everything about her marriage the ceremony her dress etc.  She was always valued her parents marriage and watching them support and go through things together even those difficult.  Talked about what it was about marriage that she values and wants when she makes that commitment.  She wants to feel wanted and valued  and appreciated.  She called herself a 1 of a kind collectible and knows that she holds significant and important value.  We looked at holding out for that person who wants to pour as much into a healthy relationship as she does. Target date: October 15, 2022.  I reviewed the treatment goals with the patient and she agreed to continue to with them as stated with an extended target date of May 08, 2023. 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  The patient prefers to meet every other week via video session.  [name], [facility]                                                                                                                   [name], [facility]               [name], [facility]               [name], [facility]  [name], [facility]               [name], [facility]               [name], [facility]               [name], [facility]               [name], [facility]               [name], [facility]

## 2022-12-27 ENCOUNTER — Ambulatory Visit (INDEPENDENT_AMBULATORY_CARE_PROVIDER_SITE_OTHER): Payer: BC Managed Care – PPO | Admitting: Adult Health

## 2022-12-27 ENCOUNTER — Encounter: Payer: Self-pay | Admitting: Adult Health

## 2022-12-27 DIAGNOSIS — F900 Attention-deficit hyperactivity disorder, predominantly inattentive type: Secondary | ICD-10-CM

## 2022-12-27 DIAGNOSIS — F4310 Post-traumatic stress disorder, unspecified: Secondary | ICD-10-CM

## 2022-12-27 DIAGNOSIS — G4700 Insomnia, unspecified: Secondary | ICD-10-CM

## 2022-12-27 DIAGNOSIS — F3181 Bipolar II disorder: Secondary | ICD-10-CM | POA: Diagnosis not present

## 2022-12-27 DIAGNOSIS — F411 Generalized anxiety disorder: Secondary | ICD-10-CM

## 2022-12-27 MED ORDER — AMPHETAMINE-DEXTROAMPHET ER 25 MG PO CP24: 25.0000 mg | ORAL_CAPSULE | ORAL | 0 refills | Status: DC

## 2022-12-27 MED ORDER — LAMOTRIGINE 150 MG PO TABS: 150.0000 mg | ORAL_TABLET | Freq: Every day | ORAL | 5 refills | Status: DC

## 2022-12-27 MED ORDER — TRAZODONE HCL 50 MG PO TABS: 50.0000 mg | ORAL_TABLET | Freq: Every day | ORAL | 5 refills | Status: DC

## 2022-12-27 MED ORDER — BUPROPION HCL ER (XL) 150 MG PO TB24: 150.0000 mg | ORAL_TABLET | Freq: Every day | ORAL | 5 refills | Status: DC

## 2022-12-27 MED ORDER — ALPRAZOLAM 0.5 MG PO TABS: 0.5000 mg | ORAL_TABLET | Freq: Every day | ORAL | 2 refills | Status: DC | PRN

## 2022-12-27 NOTE — Progress Notes (Signed)
Anne Marks [id number] [birth year] 32 y.o.  Subjective:   Patient ID:  Anne Marks is a 32 y.o. (DOB Jun 19, 1990) adult.  Chief Complaint: No chief complaint on file.   HPI Anne Marks presents to the office today for follow-up of ADD, BPD-2, GAD, and PTSD.Marland Kitchen  Describes mood today as "ok". Pleasant. Denies tearfulness. Mood symptoms - reports decreased depression - lethargy and apathetic. Reports anxiety. Reports decreased irritability. Denies panic attacks. . Reports worry and over thinking. Denies rumination. Denies cyclical thinking. Reports mood is stable. Stating "I feel like I'm doing ok". Feels like current medication regimen works well. Stable interest and motivation. Taking medications as prescribed.  Energy levels stable. Active, does not have a regular exercise routine. Enjoys some usual interests and activities. Lives with a rommate. Spending time with family and friends. Appetite adequate. Weight fluctuates. Reports sleeping difficulties. Averages 4 to 5 hours. Focus and concentration stable. Diagnosed with ADD three years ago. Reports symptoms present since childhood. Completing tasks. Managing aspects of household. Working full time - therapist Denies SI or HI.  Denies AH or VH. Denies self harm. Denies substance use. Working with a Paramedic.  Previous medication trials: Buspar, Zoloft   PHQ2-9    Flowsheet Row US OB +14 ALL [facility] from 08/25/2014 in [facility]  PHQ-2 Total Score 0 0        Review of Systems:  Review of Systems  Musculoskeletal:  Negative for gait problem.  Neurological:  Negative for tremors.  Psychiatric/Behavioral:         Please refer to HPI    Medications: I have reviewed the patient's current medications.  Current Outpatient Medications  Medication Sig Dispense Refill   ALPRAZolam (XANAX) 0.5 MG tablet Take 1 tablet (0.5 mg  total) by mouth daily as needed for anxiety. 30 tablet 2   amphetamine-dextroamphetamine (ADDERALL) 10 MG tablet Take 1 tablet (10 mg total) by mouth 2 (two) times daily. 60 tablet 0   buPROPion (WELLBUTRIN XL) 150 MG 24 hr tablet Take 1 tablet (150 mg total) by mouth daily. 30 tablet 5   ferrous sulfate 325 (65 FE) MG EC tablet Take 1 tablet (325 mg total) by mouth 2 (two) times daily. 60 tablet 3   ibuprofen (ADVIL,MOTRIN) 600 MG tablet Take 1 tablet (600 mg total) by mouth every 6 (six) hours. 30 tablet 0   lamoTRIgine (LAMICTAL) 100 MG tablet Take 1 tablet (100 mg total) by mouth daily. 7 tablet 0   lamoTRIgine (LAMICTAL) 150 MG tablet Take 1 tablet (150 mg total) by mouth daily. 30 tablet 5   Prenatal Vit-Fe Fumarate-FA (PRENATAL VITAMIN PO) Take by mouth.     No current facility-administered medications for this visit.    Medication Side Effects: None  Allergies: No Known Allergies  Past Medical History:  Diagnosis Date   Bipolar 1 disorder (HCC)    Former smoker    Mental disorder     Past Medical History, Surgical history, Social history, and Family history were reviewed and updated as appropriate.   Please see review of systems for further details on the patient's review from today.   Objective:   Physical Exam:  There were no vitals taken for this visit.  Physical Exam Constitutional:      General: Anne Marks is not in acute distress. Musculoskeletal:        General: No deformity.  Neurological:     Mental  Status: Anne Marks is alert and oriented to person, place, and time.     Coordination: Coordination normal.  Psychiatric:        Attention and Perception: Attention and perception normal. Anne Marks does not perceive auditory or visual hallucinations.        Mood and Affect: Mood normal. Mood is not anxious or depressed. Affect is not labile, blunt, angry or inappropriate.        Speech: Speech normal.        Behavior: Behavior normal.        Thought Content: Thought content  normal. Thought content is not paranoid or delusional. Thought content does not include homicidal or suicidal ideation. Thought content does not include homicidal or suicidal plan.        Cognition and Memory: Cognition and memory normal.        Judgment: Judgment normal.     Comments: Insight intact     Lab Review:     Component Value Date/Time   NA 140 07/18/2017 1010   K 4.3 07/18/2017 1010   CL 106 07/18/2017 1010   CO2 26 07/18/2017 1010   GLUCOSE 111 (H) 07/18/2017 1010   BUN 9 07/18/2017 1010   CREATININE 0.71 07/18/2017 1010   CALCIUM 9.4 07/18/2017 1010   PROT 7.6 07/18/2017 1010   ALBUMIN 3.5 07/18/2017 1010   AST 14 (L) 07/18/2017 1010   ALT 19 07/18/2017 1010   ALKPHOS 84 07/18/2017 1010   BILITOT 0.5 07/18/2017 1010   GFRNONAA >60 07/18/2017 1010   GFRAA >60 07/18/2017 1010       Component Value Date/Time   WBC 9.7 07/18/2017 1010   RBC 4.51 07/18/2017 1010   HGB 12.0 07/18/2017 1010   HCT 38.5 07/18/2017 1010   PLT 459 (H) 07/18/2017 1010   MCV 85.4 07/18/2017 1010   MCH 26.6 07/18/2017 1010   MCHC 31.2 07/18/2017 1010   RDW 14.3 07/18/2017 1010   LYMPHSABS 2.0 07/18/2017 1010   MONOABS 0.5 07/18/2017 1010   EOSABS 0.1 07/18/2017 1010   BASOSABS 0.0 07/18/2017 1010    No results found for: "POCLITH", "LITHIUM"   No results found for: "PHENYTOIN", "PHENOBARB", "VALPROATE", "CBMZ"   .res Assessment: Plan:   Plan:  PDMP reviewed  Add Adderall XR 25mg  every morning - will call in one month to report progress - ok to call in next 2 refills if working.  D/C Adderall 10mg  BID  Wellbutrin XL 150mg  every morning Lamictal 150mg  daily Xanax 0.5mg  daily  Prazosin 1mg  capsule - 1 to 2 at bedtime as needed Trazadone 50mg  - 1 to 2 at hs as needed  RTC 3 months  Patient advised to contact office with any questions, adverse effects, or acute worsening in signs and symptoms.   Discussed potential benefits, risk, and side effects of benzodiazepines to  include potential risk of tolerance and dependence, as well as possible drowsiness.  Advised patient not to drive if experiencing drowsiness and to take lowest possible effective dose to minimize risk of dependence and tolerance.   There are no diagnoses linked to this encounter.   Please see After Visit Summary for patient specific instructions.  Future Appointments  Date Time Provider Department Center  12/27/2022  9:00 AM Ova Meegan, Thereasa Solo, NP [facility] None  01/09/2023 10:00 AM [name], [facility] None  01/23/2023 10:00 AM [name], [facility] None  02/13/2023 10:00 AM [name], [facility] None  02/27/2023 10:00 AM [name], [facility]  [facility] None    No orders of the defined types were placed in this encounter.   -------------------------------

## 2023-01-02 ENCOUNTER — Ambulatory Visit: Payer: Commercial Managed Care - HMO | Admitting: Behavioral Health

## 2023-01-09 ENCOUNTER — Encounter: Payer: Self-pay | Admitting: Behavioral Health

## 2023-01-09 ENCOUNTER — Ambulatory Visit (INDEPENDENT_AMBULATORY_CARE_PROVIDER_SITE_OTHER): Payer: BC Managed Care – PPO | Admitting: Behavioral Health

## 2023-01-09 DIAGNOSIS — F319 Bipolar disorder, unspecified: Secondary | ICD-10-CM

## 2023-01-09 DIAGNOSIS — F3181 Bipolar II disorder: Secondary | ICD-10-CM

## 2023-01-09 NOTE — Progress Notes (Signed)
[facility] Counselor/Therapist Progress Note  Patient ID: THERETHA ESCAMILLA, MRN: [id number],    Date: 01/09/2023 Length of session: 10:00 to 10:57 AM, 57 minutes Time Spent: This session was held via video teletherapy. The patient consented to the video teletherapy and was located in her home during this session. She is aware it is the responsibility of the patient to secure confidentiality on her end of the session. The provider was in his outpatient therapy office for the duration of this session.      Treatment Type: Individual Therapy  Reported Symptoms: anxiety, depression  Mental Status Exam: Appearance:  Well Groomed     Behavior: Appropriate  Motor: Normal  Speech/Language:  Clear and Coherent  Affect: Appropriate  Mood: normal  Thought process: normal  Thought content:   WNL  Sensory/Perceptual disturbances:   WNL  Orientation: oriented to person, place, time/date, situation, day of week, month of year, and year  Attention: Good  Concentration: Good  Memory: WNL  Fund of knowledge:  Good  Insight:   Good  Judgment:  Good  Impulse Control: Good   Risk Assessment: Danger to Self:  No Self-injurious Behavior: No Danger to Others: No Duty to Warn:no Physical Aggression / Violence:No  Access to Firearms a concern: No  Gang Involvement:No   Subjective: The patient and her family continued a tradition of going to the beach that Thanksgiving.  It stirred some feelings of especially being around her father who has been diagnosed with Parkinson's disease and seeing some of the changes that are beginning to take place.  She sees that one of her older sisters is being avoidant of what is going on with her father.  The patient feels that she has a good balance of caring for her father but not coddling him but her siblings are not handling things very well.  She feels they have never been very good to her parents and that is creating some anger issues.  She also was  concerned for her mom being the caregiver for her father.  She sees sadness with her father knowing that he is adjusting to changes but also can see times when his sense of humor comes out which she feels she inherited from him.  We processed feelings associated with all of this including sadness, anger, at times helplessness and wanting to change situation but knowing that she cannot.  We talked about what she could do for herself, for her father and her mother and going through all of this and the adjustment to come along with that.  At the end of the session she requested that starting with the next session we begin to look at some thoughts that she has been having which are concerning to her.  There are no specific triggers that she can pinpoint but has been going on with more intensity especially over the past month.  Some thoughts are about harming herself or others but she knows they are irrational and would not act on them and does contract for safety.  She knows that some of them are probably rooted in stress related to her father's health issue, her homework etc.  We will spend time unpacking at in the next session.  Target date: October 15, 2022.  I reviewed the treatment goals with the patient and she agreed to continue to with them as stated with an extended target date of May 08, 2023. 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  The patient prefers to meet every other week via video session.  [name], [facility]                                                                                                                   [name], [facility]               [name], [facility]               [name], [facility]               [name],  [facility]               [name], [facility]               [name], [facility]               [name], [facility]               [name], [facility]               [name], [facility]               [name], [facility]

## 2023-01-16 ENCOUNTER — Ambulatory Visit: Payer: Commercial Managed Care - HMO | Admitting: Behavioral Health

## 2023-01-21 ENCOUNTER — Other Ambulatory Visit: Payer: Self-pay | Admitting: Adult Health

## 2023-01-21 DIAGNOSIS — G4700 Insomnia, unspecified: Secondary | ICD-10-CM

## 2023-01-23 ENCOUNTER — Encounter: Payer: Self-pay | Admitting: Behavioral Health

## 2023-01-23 ENCOUNTER — Ambulatory Visit: Payer: BC Managed Care – PPO | Admitting: Behavioral Health

## 2023-01-23 DIAGNOSIS — F319 Bipolar disorder, unspecified: Secondary | ICD-10-CM | POA: Diagnosis not present

## 2023-01-23 DIAGNOSIS — F3181 Bipolar II disorder: Secondary | ICD-10-CM

## 2023-01-23 NOTE — Progress Notes (Signed)
[facility] Counselor/Therapist Progress Note  Patient ID: Anne Marks, MRN: [id number],    Date: 01/23/2023 Length of session: 10:00 to 10:57 AM, 57 minutes Time Spent: This session was held via video teletherapy. The patient consented to the video teletherapy and was located in her home during this session. She is aware it is the responsibility of the patient to secure confidentiality on her end of the session. The provider was in his outpatient therapy office for the duration of this session.      Treatment Type: Individual Therapy  Reported Symptoms: anxiety, depression  Mental Status Exam: Appearance:  Well Groomed     Behavior: Appropriate  Motor: Normal  Speech/Language:  Clear and Coherent  Affect: Appropriate  Mood: normal  Thought process: normal  Thought content:   WNL  Sensory/Perceptual disturbances:   WNL  Orientation: oriented to person, place, time/date, situation, day of week, month of year, and year  Attention: Good  Concentration: Good  Memory: WNL  Fund of knowledge:  Good  Insight:   Good  Judgment:  Good  Impulse Control: Good   Risk Assessment: Danger to Self:  No Self-injurious Behavior: No Danger to Others: No Duty to Warn:no Physical Aggression / Violence:No  Access to Firearms a concern: No  Gang Involvement:No   Subjective: The patient had her biological daughter for the weekend and had a great time with her.  The conversation with her daughter prompted her to recognize that the biological father did not make much of an effort to spend time with her daughter and so the patient sent a text message encouraging that.  She said that led to a conversation which was fracturing for her because he had a way to get it away from about feeling sorry for him.  That triggered some feelings because of the way that he handled their relationship after she became pregnant and for 2 years after having their daughter.  She knows that is having some  impact having a conversation with her current partner.  She is feeling more positive about the way it is going and wants it to be long-term but has some anxiety about having a conversation with her so we talked about what might be some of the things that are holding her back from the conversation and ways that she could approach it.  She continues to have intrusive thoughts.  She says they are gross in nature and she would never act on them but still does not understand why.  We again looked a little more at what the origin of some of those thoughts are including not sleeping as well, being concerned about her father, upcoming possible conversations.  He does say that stress is not significant right now.  Her work is very good.  Her friendships are very good.  Reports that she does not feel like she is punishing herself for anything.  We will continue to explore that. Target date: October 15, 2022.  I reviewed the treatment goals with the patient and she agreed to continue to with them as stated with an extended target date of May 08, 2023. 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  The patient prefers to meet every other week via video session.  Anne Marks, [facility]  Anne Marks, [facility]               Anne Marks, [facility]               Anne Marks, [facility]               Anne Marks, [facility]               Anne Marks, [facility]               Anne Marks, [facility]               Anne Marks, [facility]               Anne Marks, [facility]               Anne Marks,  [facility]               Anne Marks, [facility]               Anne Marks, [facility]

## 2023-01-26 ENCOUNTER — Encounter: Payer: Self-pay | Admitting: Behavioral Health

## 2023-01-30 ENCOUNTER — Ambulatory Visit: Payer: 59 | Admitting: Behavioral Health

## 2023-02-13 ENCOUNTER — Encounter: Payer: Self-pay | Admitting: Behavioral Health

## 2023-02-13 ENCOUNTER — Ambulatory Visit: Payer: BC Managed Care – PPO | Admitting: Behavioral Health

## 2023-02-13 DIAGNOSIS — F3181 Bipolar II disorder: Secondary | ICD-10-CM | POA: Diagnosis not present

## 2023-02-13 NOTE — Progress Notes (Signed)
 [facility] Counselor/Therapist Progress Note  Patient ID: Anne Marks, MRN: [id number],    Date: 02/13/2023 Length of session: 10:00 to 10:57 AM, 57 minutes Time Spent: This session was held via video teletherapy. The patient consented to the video teletherapy and was located in her home during this session. She is aware it is the responsibility of the patient to secure confidentiality on her end of the session. The provider was in his outpatient therapy office for the duration of this session.      Treatment Type: Individual Therapy  Reported Symptoms: anxiety, depression  Mental Status Exam: Appearance:  Well Groomed     Behavior: Appropriate  Motor: Normal  Speech/Language:  Clear and Coherent  Affect: Appropriate  Mood: normal  Thought process: normal  Thought content:   WNL  Sensory/Perceptual disturbances:   WNL  Orientation: oriented to person, place, time/date, situation, day of week, month of year, and year  Attention: Good  Concentration: Good  Memory: WNL  Fund of knowledge:  Good  Insight:   Good  Judgment:  Good  Impulse Control: Good   Risk Assessment: Danger to Self:  No Self-injurious Behavior: No Danger to Others: No Duty to Warn:no Physical Aggression / Violence:No  Access to Firearms a concern: No  Gang Involvement:No   Subjective: The past few weeks have been very difficult for the patient.  One of her sisters had a baby and that sister and her family moved in with her parents putting more stress on them and that concerns the patient.  Another one of her sisters had a heart attack just before Christmas and that was difficult for a while.  She does not have a great relationship with that sister but still wants to provide care as best that she can.  She and her partner had an argument before Christmas and the plan was to spend Christmas with him but that was canceled.  She did go to the area where he lives to see him and one of her friends  around her birthday and [place] but said they had another argument and that for the most part ended the relationship.  He said some very unkind things to her which she knows are not the truth but were still very hurtful to her.  She is very aware that she has gained an increase in self-control in terms of how she verbally responds when someone hurts her but says in this case she knows that she was very hurtful to him and does not like feeling like that are getting that angry.  They have since spoken and things are a little calmer but they are not together.  She is questioning in the moment why she has difficulty with finding people who treat her well when she treats them well and we spent time unpacking that.  We talked about the growth that she has made over the past several years in terms of being kind to herself and we emphasized the importance of that right now especially.  Because her birthday did not go well she is scheduling a separate birthday event with local friends and making bracelets and having dinner.  Encouraged continual kindness to herself.  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   Target date: October 15, 2022.  I reviewed the treatment goals with the patient and she agreed to  continue to with them as stated with an extended target date of May 08, 2023. 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  The patient prefers to meet every other week via video session.  Lorrene CHRISTELLA Hasten, [facility]                                                                                                                   Lorrene CHRISTELLA Hasten, [facility]               Lorrene CHRISTELLA Hasten,  [facility]               Lorrene CHRISTELLA Hasten, [facility]               Lorrene CHRISTELLA Hasten, [facility]               Lorrene CHRISTELLA Hasten, [facility]               Lorrene CHRISTELLA Hasten, [facility]               Lorrene CHRISTELLA Hasten, [facility]               Lorrene CHRISTELLA Hasten, [facility]               Lorrene CHRISTELLA Hasten, [facility]               Lorrene CHRISTELLA Hasten, [facility]               Lorrene CHRISTELLA Hasten, [facility]               Lorrene CHRISTELLA Hasten, [facility]

## 2023-02-20 ENCOUNTER — Encounter: Payer: Self-pay | Admitting: Behavioral Health

## 2023-02-20 ENCOUNTER — Ambulatory Visit: Payer: BC Managed Care – PPO | Admitting: Behavioral Health

## 2023-02-20 DIAGNOSIS — F3181 Bipolar II disorder: Secondary | ICD-10-CM

## 2023-02-20 NOTE — Progress Notes (Signed)
  [facility] Counselor/Therapist Progress Note  Patient ID: Anne Marks, MRN: [id number],    Date: 02/20/2023 Length of session: 9:00 to 9:57 AM, 57 minutes Time Spent: This session was held via video teletherapy. The patient consented to the video teletherapy and was located in her home during this session. She is aware it is the responsibility of the patient to secure confidentiality on her end of the session. The provider was in his outpatient therapy office for the duration of this session.      Treatment Type: Individual Therapy  Reported Symptoms: anxiety, depression  Mental Status Exam: Appearance:  Well Groomed     Behavior: Appropriate  Motor: Normal  Speech/Language:  Clear and Coherent  Affect: Appropriate  Mood: normal  Thought process: normal  Thought content:   WNL  Sensory/Perceptual disturbances:   WNL  Orientation: oriented to person, place, time/date, situation, day of week, month of year, and year  Attention: Good  Concentration: Good  Memory: WNL  Fund of knowledge:  Good  Insight:   Good  Judgment:  Good  Impulse Control: Good   Risk Assessment: Danger to Self:  No Self-injurious Behavior: No Danger to Others: No Duty to Warn:no Physical Aggression / Violence:No  Access to Firearms a concern: No  Gang Involvement:No   Subjective: We processed the feelings and emotions that the patient is going through especially over the past few weeks.  She acknowledges that anger is 1 of those primary emotions that she is experiencing.  Most that is related to relationships.  She recognizes that throughout most of her adult life she has always dated people who she knew needed help.  She does not deny the fact that she has a caregiver but says that she has worked hard on becoming stronger and healthier and she does not think it is unfair to expect her partner to do the same without her having to play a big part of that.  She said because especially she  feels like she has always had to try to fix him and she is exhausted doing that.  Intellectually she knows her healthy people out there today but mostly it feels somewhat overwhelming.  She is having some difficulty with emotional expression because she does not like to feel like she is tearful but we talked about the healthy benefits of that.  She did start working on some music which she has not done in a long time and says it was therapeutic benefit from that.  We focused on what she had done minutes of sticking to what her expectations are in dating someone else.  Target date: October 15, 2022.  I reviewed the treatment goals with the patient and she agreed to continue to with them as stated with an extended target date of May 08, 2023. 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  The patient prefers to meet every other week via video session.  Lorrene CHRISTELLA Hasten, [facility]  Lorrene CHRISTELLA Hasten, [facility]               Lorrene CHRISTELLA Hasten, [facility]               Lorrene CHRISTELLA Hasten, [facility]               Lorrene CHRISTELLA Hasten, [facility]               Lorrene CHRISTELLA Hasten, [facility]               Lorrene CHRISTELLA Hasten, [facility]               Lorrene CHRISTELLA Hasten, [facility]               Lorrene CHRISTELLA Hasten, [facility]               Lorrene CHRISTELLA Hasten, [facility]               Lorrene CHRISTELLA Hasten, [facility]               Lorrene CHRISTELLA Hasten, [facility]               Lorrene CHRISTELLA Hasten, [facility]               Lorrene CHRISTELLA Hasten, [facility]

## 2023-02-27 ENCOUNTER — Ambulatory Visit: Payer: BC Managed Care – PPO | Admitting: Behavioral Health

## 2023-02-27 ENCOUNTER — Encounter: Payer: Self-pay | Admitting: Behavioral Health

## 2023-02-27 DIAGNOSIS — F3181 Bipolar II disorder: Secondary | ICD-10-CM

## 2023-02-27 DIAGNOSIS — F319 Bipolar disorder, unspecified: Secondary | ICD-10-CM

## 2023-02-27 NOTE — Progress Notes (Signed)
Behavioral Health Counselor/Therapist Progress Note  Patient ID: BRIELL REVOLORIO, MRN: [id number],    Date: 02/27/2023 Length of session: 10:04 AM until 10:56 AM, 52 minutes Time Spent: This session was held via video teletherapy. The patient consented to the video teletherapy and was located in her home during this session. She is aware it is the responsibility of the patient to secure confidentiality on her end of the session. The provider was in his outpatient therapy office for the duration of this session.      Treatment Type: Individual Therapy  Reported Symptoms: anxiety, depression  Mental Status Exam: Appearance:  Well Groomed     Behavior: Appropriate  Motor: Normal  Speech/Language:  Clear and Coherent  Affect: Appropriate  Mood: normal  Thought process: normal  Thought content:   WNL  Sensory/Perceptual disturbances:   WNL  Orientation: oriented to person, place, time/date, situation, day of week, month of year, and year  Attention: Good  Concentration: Good  Memory: WNL  Fund of knowledge:  Good  Insight:   Good  Judgment:  Good  Impulse Control: Good   Risk Assessment: Danger to Self:  No Self-injurious Behavior: No Danger to Others: No Duty to Warn:no Physical Aggression / Violence:No  Access to Firearms a concern: No  Gang Involvement:No   Subjective: For the most part the past week has been relatively uneventful.  She did spend some time with her friends and is trying to get out of the apartment.  She is managing her workload so that is not overwhelming although there are some busy days coming up.  She did text back and forth with her former partner which made her more reflective of her relationships.  She has come to the conclusion that for the most part he has not been fully her responsibility that they did not work and she feels that she poured a lot into those relationships and has gotten herself much healthier and that some of her partners were  not as healthy and she could not fix them.  We continued to look at how hard she is working to make herself better but also what she is looking for in partner who is healthy and what her expectations are. Target date: October 15, 2022.  I reviewed the treatment goals with the patient and she agreed to continue to with them as stated with an extended target date of May 08, 2023. 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  The patient prefers to meet every other week via video session.  [name], [facility]                                                                                                                   [name], [facility]               [name], [facility]               [name],  [facility]               [name], [facility]               [name], [facility]               [name], [facility]               [name], [facility]               [name], [facility]               [name], [facility]               [name], [facility]               [name], [facility]               [name], [facility]               [name], [facility]               [name], [facility]

## 2023-03-13 ENCOUNTER — Ambulatory Visit: Payer: BC Managed Care – PPO | Admitting: Behavioral Health

## 2023-03-27 ENCOUNTER — Ambulatory Visit: Payer: BC Managed Care – PPO | Admitting: Behavioral Health

## 2023-03-27 DIAGNOSIS — F319 Bipolar disorder, unspecified: Secondary | ICD-10-CM | POA: Diagnosis not present

## 2023-03-27 NOTE — Progress Notes (Signed)
[facility] Counselor/Therapist Progress Note  Patient ID: Anne Marks, MRN: [id number],    Date: 03/27/2023 Length of session: 10:04 AM until 10:55 AM, 55 minutes Time Spent: This session was held via video teletherapy. The patient consented to the video teletherapy and was located in her home during this session. She is aware it is the responsibility of the patient to secure confidentiality on her end of the session. The provider was in his outpatient therapy office for the duration of this session.      Treatment Type: Individual Therapy  Reported Symptoms: anxiety, depression  Mental Status Exam: Appearance:  Well Groomed     Behavior: Appropriate  Motor: Normal  Speech/Language:  Clear and Coherent  Affect: Appropriate  Mood: normal  Thought process: normal  Thought content:   WNL  Sensory/Perceptual disturbances:   WNL  Orientation: oriented to person, place, time/date, situation, day of week, month of year, and year  Attention: Good  Concentration: Good  Memory: WNL  Fund of knowledge:  Good  Insight:   Good  Judgment:  Good  Impulse Control: Good   Risk Assessment: Danger to Self:  No Self-injurious Behavior: No Danger to Others: No Duty to Warn:no Physical Aggression / Violence:No  Access to Firearms a concern: No  Gang Involvement:No   Subjective: The patient's job is still going well.  She has good relationships with friends and with some family.  She says that she basically right now is going through the motions of going to work coming home and having dinner getting ready for bed and going to sleep.  She is not sleeping great but says that she acknowledges her primary feelings right now are feeling angry and fragile.  Most of them is related to relationship.  She says that she knows she has worked hard to get healthier but that no one in her life has worked to get healthier.  Part of that was started on by a phone call from the guys she broke up  with recently.  Walked through her feeling like everybody else is able to be in relationship in a healthy way but she cannot seem to be in his mind and herself.  She says that at times she dislikes herself and is not pleased what she sees.  We attempted to reframe those thoughts to look at who she is not at all areas of her life and the work that she has put in much healthier.  Target date: October 15, 2022.  I reviewed the treatment goals with the patient and she agreed to continue to with them as stated with an extended target date of May 08, 2023. 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  The patient prefers to meet every other week via video session.  [name], [facility]  [name], [facility]               [name], [facility]               [name], [facility]               [name], [facility]               [name], [facility]               [name], [facility]               [name], [facility]               [name], [facility]               [name], [facility]               [name], [facility]               [name], [facility]               [name], [facility]               [name], [facility]               [name], [facility]               [name], [facility]

## 2023-04-03 ENCOUNTER — Telehealth (INDEPENDENT_AMBULATORY_CARE_PROVIDER_SITE_OTHER): Payer: Self-pay | Admitting: Adult Health

## 2023-04-03 ENCOUNTER — Encounter: Payer: Self-pay | Admitting: Adult Health

## 2023-04-03 DIAGNOSIS — F411 Generalized anxiety disorder: Secondary | ICD-10-CM

## 2023-04-03 DIAGNOSIS — F3181 Bipolar II disorder: Secondary | ICD-10-CM

## 2023-04-03 DIAGNOSIS — F4310 Post-traumatic stress disorder, unspecified: Secondary | ICD-10-CM | POA: Diagnosis not present

## 2023-04-03 DIAGNOSIS — F900 Attention-deficit hyperactivity disorder, predominantly inattentive type: Secondary | ICD-10-CM

## 2023-04-03 MED ORDER — PRAZOSIN HCL 1 MG PO CAPS: ORAL_CAPSULE | ORAL | 2 refills | Status: DC

## 2023-04-03 MED ORDER — TRAZODONE HCL 50 MG PO TABS: 50.0000 mg | ORAL_TABLET | Freq: Every day | ORAL | 0 refills | Status: DC

## 2023-04-03 MED ORDER — AMPHETAMINE-DEXTROAMPHET ER 20 MG PO CP24
20.0000 mg | ORAL_CAPSULE | ORAL | 0 refills | Status: DC
Start: 2023-04-03 — End: 2023-05-03

## 2023-04-03 MED ORDER — ALPRAZOLAM 0.5 MG PO TABS: 0.5000 mg | ORAL_TABLET | Freq: Every day | ORAL | 2 refills | Status: DC | PRN

## 2023-04-03 NOTE — Progress Notes (Signed)
 Anne Marks [id number] 08/08/1990 33 y.o.  Virtual Visit via Video Note  I connected with pt @ on 04/03/23 at 10:00 AM EST by a video enabled telemedicine application and verified that I am speaking with the correct person using two identifiers.   I discussed the limitations of evaluation and management by telemedicine and the availability of in person appointments. The patient expressed understanding and agreed to proceed.  I discussed the assessment and treatment plan with the patient. The patient was provided an opportunity to ask questions and all were answered. The patient agreed with the plan and demonstrated an understanding of the instructions.   The patient was advised to call back or seek an in-person evaluation if the symptoms worsen or if the condition fails to improve as anticipated.  I provided 25 minutes of non-face-to-face time during this encounter.  The patient was located at home.  The provider was located at [facility].   Dorothyann Gibbs, NP   Subjective:   Patient ID:  Anne Marks is a 33 y.o. (DOB Oct 18, 1990) adult.  Chief Complaint: No chief complaint on file.   HPI KEIRSTIN MUSIL presents for follow-up of ADD, BPD-2, GAD, and PTSD.  Describes mood today as "ok". Pleasant. Denies tearfulness. Mood symptoms - reports depression - "sadder than usual". Reports some lethargy and feeling detached. Reports anxiety - "it's on and off". Denies irritability. Reports decreased interest and motivation. Denies panic attacks. Denies worry, rumination and over thinking. Denies cyclical thinking. Reports mood as stable. Stating "I feel like I'm doing as well as I can".  Taking medications as prescribed.  Energy levels lower. Active, does not have a regular exercise routine. Enjoys some usual interests and activities. Lives with a rommate. Spending time with family and friends. Appetite adequate. Weight fluctuates. Reports sleeping difficulties. Averages 5  hours. Focus and concentration stable. Diagnosed with ADD three years ago. Completing tasks. Managing aspects of household. Working full time - therapist Denies SI or HI.  Denies AH or VH. Denies self harm. Denies substance use. Working with a Paramedic.  Previous medication trials: Buspar, Zoloft  Review of Systems:  Review of Systems  Musculoskeletal:  Negative for gait problem.  Neurological:  Negative for tremors.  Psychiatric/Behavioral:         Please refer to HPI    Medications: I have reviewed the patient's current medications.  Current Outpatient Medications  Medication Sig Dispense Refill   ALPRAZolam (XANAX) 0.5 MG tablet Take 1 tablet (0.5 mg total) by mouth daily as needed for anxiety. 30 tablet 2   amphetamine-dextroamphetamine (ADDERALL XR) 25 MG 24 hr capsule Take 1 capsule by mouth every morning. 30 capsule 0   buPROPion (WELLBUTRIN XL) 150 MG 24 hr tablet Take 1 tablet (150 mg total) by mouth daily. 30 tablet 5   ferrous sulfate 325 (65 FE) MG EC tablet Take 1 tablet (325 mg total) by mouth 2 (two) times daily. 60 tablet 3   ibuprofen (ADVIL,MOTRIN) 600 MG tablet Take 1 tablet (600 mg total) by mouth every 6 (six) hours. 30 tablet 0   lamoTRIgine (LAMICTAL) 150 MG tablet Take 1 tablet (150 mg total) by mouth daily. 30 tablet 5   Prenatal Vit-Fe Fumarate-FA (PRENATAL VITAMIN PO) Take by mouth.     traZODone (DESYREL) 50 MG tablet TAKE 1 TABLET BY MOUTH EVERYDAY AT BEDTIME 90 tablet 0   No current facility-administered medications for this visit.    Medication Side Effects: None  Allergies: No Known  Allergies  Past Medical History:  Diagnosis Date   Bipolar 1 disorder (HCC)    Former smoker    Mental disorder     No family history on file.  Social History   Socioeconomic History   Marital status: Single    Spouse name: Not on file   Number of children: Not on file   Years of education: Not on file   Highest education level: Not on file   Occupational History   Not on file  Tobacco Use   Smoking status: Former    Current packs/day: 0.00    Types: Cigarettes    Quit date: 10/09/2011    Years since quitting: 11.4   Smokeless tobacco: Never  Substance and Sexual Activity   Alcohol use: Yes    Comment: a weekedn a month ; not while pregnant   Drug use: No   Sexual activity: Yes    Comment: one week ago  Other Topics Concern   Not on file  Social History Narrative   Not on file   Social Drivers of Health   Financial Resource Strain: Low Risk  (02/11/2022)   Received from [facility], [facility]   Overall Financial Resource Strain (CARDIA)    Difficulty of Paying Living Expenses: Not very hard  Food Insecurity: Food Insecurity Present (02/11/2022)   Received from [facility], [facility]   Hunger Vital Sign    Worried About Running Out of Food in the Last Year: Sometimes true    Ran Out of Food in the Last Year: Patient declined  Transportation Needs: No Transportation Needs (02/11/2022)   Received from [employer], [facility]   PRAPARE - Transportation    Lack of Transportation (Medical): No    Lack of Transportation (Non-Medical): No  Physical Activity: Insufficiently Active (02/11/2022)   Received from [facility], [facility]   Exercise Vital Sign    Days of Exercise per Week: 1 day    Minutes of Exercise per Session: 10 min  Stress: No Stress Concern Present (02/11/2022)   Received from [facility], [facility] Occupational [facility] - Occupational Stress Questionnaire    Feeling of Stress : Only a little  Social Connections: Socially Integrated (02/11/2022)   Received from [facility], [facility]   Social Network    How would you rate your social network (family, work, friends)?: Good participation with social networks  Intimate Partner Violence: Not At Risk (02/11/2022)   Received from [facility], [facility]   HITS    Over the last 12 months how often did your  partner physically hurt you?: Never    Over the last 12 months how often did your partner insult you or talk down to you?: Never    Over the last 12 months how often did your partner threaten you with physical harm?: Never    Over the last 12 months how often did your partner scream or curse at you?: Never    Past Medical History, Surgical history, Social history, and Family history were reviewed and updated as appropriate.   Please see review of systems for further details on the patient's review from today.   Objective:   Physical Exam:  There were no vitals taken for this visit.  Physical Exam Constitutional:      General: Ellyse is not in acute distress. Musculoskeletal:        General: No deformity.  Neurological:     Mental Status: Tifini is  alert and oriented to person, place, and time.     Coordination: Coordination normal.  Psychiatric:        Attention and Perception: Attention and perception normal. Claryssa does not perceive auditory or visual hallucinations.        Mood and Affect: Affect is not labile, blunt, angry or inappropriate.        Speech: Speech normal.        Behavior: Behavior normal.        Thought Content: Thought content normal. Thought content is not paranoid or delusional. Thought content does not include homicidal or suicidal ideation. Thought content does not include homicidal or suicidal plan.        Cognition and Memory: Cognition and memory normal.        Judgment: Judgment normal.     Comments: Insight intact     Lab Review:     Component Value Date/Time   NA 140 07/18/2017 1010   K 4.3 07/18/2017 1010   CL 106 07/18/2017 1010   CO2 26 07/18/2017 1010   GLUCOSE 111 (H) 07/18/2017 1010   BUN 9 07/18/2017 1010   CREATININE 0.71 07/18/2017 1010   CALCIUM 9.4 07/18/2017 1010   PROT 7.6 07/18/2017 1010   ALBUMIN 3.5 07/18/2017 1010   AST 14 (L) 07/18/2017 1010   ALT 19 07/18/2017 1010   ALKPHOS 84 07/18/2017 1010   BILITOT 0.5 07/18/2017 1010    GFRNONAA >60 07/18/2017 1010   GFRAA >60 07/18/2017 1010       Component Value Date/Time   WBC 9.7 07/18/2017 1010   RBC 4.51 07/18/2017 1010   HGB 12.0 07/18/2017 1010   HCT 38.5 07/18/2017 1010   PLT 459 (H) 07/18/2017 1010   MCV 85.4 07/18/2017 1010   MCH 26.6 07/18/2017 1010   MCHC 31.2 07/18/2017 1010   RDW 14.3 07/18/2017 1010   LYMPHSABS 2.0 07/18/2017 1010   MONOABS 0.5 07/18/2017 1010   EOSABS 0.1 07/18/2017 1010   BASOSABS 0.0 07/18/2017 1010    No results found for: "POCLITH", "LITHIUM"   No results found for: "PHENYTOIN", "PHENOBARB", "VALPROATE", "CBMZ"   .res Assessment: Plan:    Plan:  Started on iron and Vitamin D  PDMP reviewed  Decrese Adderall XR 25mg  to 20mg  every morning - will call in one month to report progress - ok to call in next 2 refills if working.   Wellbutrin XL 150mg  every morning Lamictal 150mg  daily Xanax 0.5mg  daily  Prazosin 1mg  capsule - 1 to 2 at bedtime as needed Trazadone 50mg  - 1 to 2 at hs as needed  RTC 3 months  25 minutes spent dedicated to the care of this patient on the date of this encounter to include pre-visit review of records, ordering of medication, post visit documentation, and face-to-face time with the patient discussing ADD, BPD-2, GAD, and PTSD. Discussed continuing current medication regimen.  Patient advised to contact office with any questions, adverse effects, or acute worsening in signs and symptoms.   Discussed potential benefits, risk, and side effects of benzodiazepines to include potential risk of tolerance and dependence, as well as possible drowsiness.  Advised patient not to drive if experiencing drowsiness and to take lowest possible effective dose to minimize risk of dependence and tolerance.   Counseled patient regarding potential benefits, risks, and side effects of Lamictal to include potential risk of Stevens-Johnson syndrome. Advised patient to stop taking Lamictal and contact office  immediately if rash develops and to seek urgent medical  attention if rash is severe and/or spreading quickly.   Discussed potential benefits, risks, and side effects of stimulants with patient to include increased heart rate, palpitations, insomnia, increased anxiety, increased irritability, or decreased appetite.  Instructed patient to contact office if experiencing any significant tolerability issues.   There are no diagnoses linked to this encounter.   Please see After Visit Summary for patient specific instructions.  Future Appointments  Date Time Provider Department Center  04/03/2023 10:00 AM Nabria Nevin, Thereasa Solo, NP [facility] None  04/10/2023 10:00 AM [name], [facility] None  04/24/2023 10:00 AM [name], [facility] None  05/08/2023 10:00 AM [name], [facility] None  05/22/2023 10:00 AM [name], [facility] None  06/05/2023 10:00 AM [name], [facility] None    No orders of the defined types were placed in this encounter.     -------------------------------

## 2023-04-10 ENCOUNTER — Ambulatory Visit: Payer: BC Managed Care – PPO | Admitting: Behavioral Health

## 2023-04-10 ENCOUNTER — Encounter: Payer: Self-pay | Admitting: Behavioral Health

## 2023-04-10 DIAGNOSIS — F319 Bipolar disorder, unspecified: Secondary | ICD-10-CM | POA: Diagnosis not present

## 2023-04-10 DIAGNOSIS — F3181 Bipolar II disorder: Secondary | ICD-10-CM

## 2023-04-10 NOTE — Progress Notes (Signed)
 [facility] Counselor/Therapist Progress Note  Patient ID: Anne Marks, MRN: [id number],    Date: 04/10/2023 Length of session: 10:01 AM until 10:55 AM, 54 minutes Time Spent: This session was held via video teletherapy. The patient consented to the video teletherapy and was located in her home during this session. She is aware it is the responsibility of the patient to secure confidentiality on her end of the session. The provider was in his outpatient therapy office for the duration of this session.      Treatment Type: Individual Therapy  Reported Symptoms: anxiety, depression  Mental Status Exam: Appearance:  Well Groomed     Behavior: Appropriate  Motor: Normal  Speech/Language:  Clear and Coherent  Affect: Appropriate  Mood: normal  Thought process: normal  Thought content:   WNL  Sensory/Perceptual disturbances:   WNL  Orientation: oriented to person, place, time/date, situation, day of week, month of year, and year  Attention: Good  Concentration: Good  Memory: WNL  Fund of knowledge:  Good  Insight:   Good  Judgment:  Good  Impulse Control: Good   Risk Assessment: Danger to Self:  No Self-injurious Behavior: No Danger to Others: No Duty to Warn:no Physical Aggression / Violence:No  Access to Firearms a concern: No  Gang Involvement:No   Subjective: The patient's job is still going well.  She is now doing some Armed forces technical officer roles and is finding satisfaction in that.  There was one employee situation that she had to deal with that was difficult but she felt she handled it well.  She reports that she is having some conversation with her ex-partner but only in a friend capacity and she is not sure that he is capable of doing that.  She said that in stepping away from that relationship she sees more of the issues that he had and recognized that they were not necessarily hers.  We talked about the progress that she had made in who she is in  relationships and being able to not blame herself when they did not work out well.  She had a conversation with someone that she has not seen in a long time about a past relationship that she was in.  She said that he even though it had been years had told people lies about the relationship which she felt she offered insight into but also recognize that she had come a long way from that.  She is spending more time with friends and family especially her father.  She is going to be with her daughter in a few weekends for a long weekend and is very excited about that.  She is coming out of the "funk" that she has been a part of and feels that she is making good decisions for her own self-care. Target date: October 15, 2022.  I reviewed the treatment goals with the patient and she agreed to continue to with them as stated with an extended target date of May 08, 2023. 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  The patient prefers to meet every other week via video session.  [name], [facility]

## 2023-04-24 ENCOUNTER — Ambulatory Visit (INDEPENDENT_AMBULATORY_CARE_PROVIDER_SITE_OTHER): Payer: BC Managed Care – PPO | Admitting: Behavioral Health

## 2023-04-24 DIAGNOSIS — F3181 Bipolar II disorder: Secondary | ICD-10-CM | POA: Diagnosis not present

## 2023-04-24 NOTE — Progress Notes (Signed)
 [facility] Counselor/Therapist Progress Note  Patient ID: Anne Marks, MRN: [id number],    Date: 04/24/2023 Length of session: 10:04 AM until 10:59 AM, 55 minutes Time Spent: This session was held via video teletherapy. The patient consented to the video teletherapy and was located in her home during this session. She is aware it is the responsibility of the patient to secure confidentiality on her end of the session. The provider was in his outpatient therapy office for the duration of this session.      Treatment Type: Individual Therapy  Reported Symptoms: anxiety, depression  Mental Status Exam: Appearance:  Well Groomed     Behavior: Appropriate  Motor: Normal  Speech/Language:  Clear and Coherent  Affect: Appropriate  Mood: normal  Thought process: normal  Thought content:   WNL  Sensory/Perceptual disturbances:   WNL  Orientation: oriented to person, place, time/date, situation, day of week, month of year, and year  Attention: Good  Concentration: Good  Memory: WNL  Fund of knowledge:  Good  Insight:   Good  Judgment:  Good  Impulse Control: Good   Risk Assessment: Danger to Self:  No Self-injurious Behavior: No Danger to Others: No Duty to Warn:no Physical Aggression / Violence:No  Access to Firearms a concern: No  Gang Involvement:No   Subjective: The patient is seeing an improvement of mood and not feeling so down as much but there are some things that she is focused on.  She recognizes that she is doing a lot the things at work and knows that she does a lot of different things well and is trying to final down what that looks like for her.  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  She does have specific target dates for this year, 2026 and 2027 to work toward more specialization.  Encouraged continued many boundaries especially in the work setting which she is trying to do.  She also recognizes she is working a lot to avoid  especially family situations and other relationships.  She spent a good part of the weekend with her family because her mother wanted a family portrait and she said it just emphasized how much dysfunction there is in her extended family especially with her siblings.  Her brother who lives in [place] and whom she has not seen in 10 years since a very hurtful and thinks it made her very angry.  She handled that very appropriately but says she realizes that with her family she is the anchor and she does not feel that she has a lot to anchor to with her family.  She knows that her parents tried but most of her life they have had difficulty with her siblings and she has come out on the short end of that.  She knows her parents recognize that but it many times she helps her siblings to protect her parents who cannot necessarily help her siblings.  We talked about how to set boundaries with that also.  Poor what her mother said that was hurtful and angering had to do with relationships.  We talked about not using him as a measuring stick but knowing what it is that she wants in a healthy relationship.  She feels she has a good idea of that but because of the most recent relationship does not feel that she is ready to start talking to anyone again.  She did talk to her ex and recognized that she made a  very good decision in terms of not continuing in relationship with him.  We will start with that in the next session. The patient does contract for safety having no thoughts of hurting herself or anyone else. Target date: October 15, 2022.  I reviewed the treatment goals with the patient and she agreed to continue to with them as stated with an extended target date of May 08, 2023. I reviewed the treatment goals with the patient and we agreed to continue them as written.  Target date of November 07, 2023. 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  The patient prefers to meet every other week via video session.  [name], [facility]                                                                                                                   [name], [facility]

## 2023-05-03 ENCOUNTER — Telehealth: Payer: BC Managed Care – PPO | Admitting: Adult Health

## 2023-05-03 ENCOUNTER — Encounter: Payer: Self-pay | Admitting: Adult Health

## 2023-05-03 DIAGNOSIS — F3181 Bipolar II disorder: Secondary | ICD-10-CM

## 2023-05-03 DIAGNOSIS — F4310 Post-traumatic stress disorder, unspecified: Secondary | ICD-10-CM

## 2023-05-03 DIAGNOSIS — F900 Attention-deficit hyperactivity disorder, predominantly inattentive type: Secondary | ICD-10-CM

## 2023-05-03 DIAGNOSIS — F411 Generalized anxiety disorder: Secondary | ICD-10-CM

## 2023-05-03 MED ORDER — TRAZODONE HCL 50 MG PO TABS: 50.0000 mg | ORAL_TABLET | Freq: Every day | ORAL | 0 refills | Status: DC

## 2023-05-03 MED ORDER — ALPRAZOLAM 0.5 MG PO TABS: 0.5000 mg | ORAL_TABLET | Freq: Every day | ORAL | 2 refills | Status: DC | PRN

## 2023-05-03 MED ORDER — AMPHETAMINE-DEXTROAMPHET ER 20 MG PO CP24: 20.0000 mg | ORAL_CAPSULE | Freq: Every day | ORAL | 0 refills | Status: DC

## 2023-05-03 MED ORDER — AMPHETAMINE-DEXTROAMPHET ER 20 MG PO CP24: 20.0000 mg | ORAL_CAPSULE | ORAL | 0 refills | Status: DC

## 2023-05-03 MED ORDER — PRAZOSIN HCL 1 MG PO CAPS: ORAL_CAPSULE | ORAL | 2 refills | Status: DC

## 2023-05-03 NOTE — Progress Notes (Signed)
 Anne Marks [id number] August 11, 1990 33 y.o.  Virtual Visit via Video Note  I connected with pt @ on 05/03/23 at  8:30 AM EDT by a video enabled telemedicine application and verified that I am speaking with the correct person using two identifiers.   I discussed the limitations of evaluation and management by telemedicine and the availability of in person appointments. The patient expressed understanding and agreed to proceed.  I discussed the assessment and treatment plan with the patient. The patient was provided an opportunity to ask questions and all were answered. The patient agreed with the plan and demonstrated an understanding of the instructions.   The patient was advised to call back or seek an in-person evaluation if the symptoms worsen or if the condition fails to improve as anticipated.  I provided 25 minutes of non-face-to-face time during this encounter.  The patient was located at home.  The provider was located at [facility].   Dorothyann Gibbs, NP   Subjective:   Patient ID:  Anne Marks is a 33 y.o. (DOB November 13, 1990) adult.  Chief Complaint: No chief complaint on file.   HPI Anne Marks presents for follow-up of ADD, BPD-2, GAD, and PTSD.  Describes mood today as "ok". Pleasant. Denies tearfulness. Mood symptoms - reports decreased depression Reports anxiety - "still a little peaky".  Denies irritability. Reports improved interest. Reports lower motivation. Denies panic attacks. Denies worry, rumination and over thinking. Denies cyclical thinking. Reports mood as consistent. Stating "I feel like I'm doing better".  Taking medications as prescribed.  Energy levels improved. Active, does not have a regular exercise routine. Enjoys some usual interests and activities. Lives with a rommate. Spending time with family and friends. Appetite adequate. Weight fluctuates. Reports sleep has improved. Averages 6 hours. Focus and concentration stable. Completing  tasks. Managing aspects of household. Working full time - therapist Denies SI or HI.  Denies AH or VH. Denies self harm. Denies substance use. Working with a Paramedic.  Previous medication trials: Buspar, Zoloft  Review of Systems:  Review of Systems  Musculoskeletal:  Negative for gait problem.  Neurological:  Negative for tremors.  Psychiatric/Behavioral:         Please refer to HPI    Medications: I have reviewed the patient's current medications.  Current Outpatient Medications  Medication Sig Dispense Refill   ALPRAZolam (XANAX) 0.5 MG tablet Take 1 tablet (0.5 mg total) by mouth daily as needed for anxiety. 30 tablet 2   amphetamine-dextroamphetamine (ADDERALL XR) 20 MG 24 hr capsule Take 1 capsule (20 mg total) by mouth every morning. 30 capsule 0   buPROPion (WELLBUTRIN XL) 150 MG 24 hr tablet Take 1 tablet (150 mg total) by mouth daily. 30 tablet 5   ferrous sulfate 325 (65 FE) MG EC tablet Take 1 tablet (325 mg total) by mouth 2 (two) times daily. 60 tablet 3   ibuprofen (ADVIL,MOTRIN) 600 MG tablet Take 1 tablet (600 mg total) by mouth every 6 (six) hours. 30 tablet 0   lamoTRIgine (LAMICTAL) 150 MG tablet Take 1 tablet (150 mg total) by mouth daily. 30 tablet 5   prazosin (MINIPRESS) 1 MG capsule Take one to two tablets at bedtime. 60 capsule 2   Prenatal Vit-Fe Fumarate-FA (PRENATAL VITAMIN PO) Take by mouth.     traZODone (DESYREL) 50 MG tablet Take 1 tablet (50 mg total) by mouth at bedtime. 90 tablet 0   No current facility-administered medications for this visit.    Medication  Side Effects: None  Allergies: No Known Allergies  Past Medical History:  Diagnosis Date   Bipolar 1 disorder (HCC)    Former smoker    Mental disorder     No family history on file.  Social History   Socioeconomic History   Marital status: Single    Spouse name: Not on file   Number of children: Not on file   Years of education: Not on file   Highest education level: Not  on file  Occupational History   Not on file  Tobacco Use   Smoking status: Former    Current packs/day: 0.00    Types: Cigarettes    Quit date: 10/09/2011    Years since quitting: 11.5   Smokeless tobacco: Never  Substance and Sexual Activity   Alcohol use: Yes    Comment: a weekedn a month ; not while pregnant   Drug use: No   Sexual activity: Yes    Comment: one week ago  Other Topics Concern   Not on file  Social History Narrative   Not on file   Social Drivers of Health   Financial Resource Strain: Low Risk  (02/11/2022)   Received from [facility], [facility]   Overall Financial Resource Strain (CARDIA)    Difficulty of Paying Living Expenses: Not very hard  Food Insecurity: Food Insecurity Present (02/11/2022)   Received from [facility], [facility]   Hunger Vital Sign    Worried About Running Out of Food in the Last Year: Sometimes true    Ran Out of Food in the Last Year: Patient declined  Transportation Needs: No Transportation Needs (02/11/2022)   Received from [employer], [facility]   PRAPARE - Transportation    Lack of Transportation (Medical): No    Lack of Transportation (Non-Medical): No  Physical Activity: Insufficiently Active (02/11/2022)   Received from [facility], [facility]   Exercise Vital Sign    Days of Exercise per Week: 1 day    Minutes of Exercise per Session: 10 min  Stress: No Stress Concern Present (02/11/2022)   Received from [facility], [facility] - Occupational Stress Questionnaire    Feeling of Stress : Only a little  Social Connections: Socially Integrated (02/11/2022)   Received from [facility], [facility]   Social Network    How would you rate your social network (family, work, friends)?: Good participation with social networks  Intimate Partner Violence: Not At Risk (02/11/2022)   Received from [facility], [facility]   HITS    Over the last 12 months how often  did your partner physically hurt you?: Never    Over the last 12 months how often did your partner insult you or talk down to you?: Never    Over the last 12 months how often did your partner threaten you with physical harm?: Never    Over the last 12 months how often did your partner scream or curse at you?: Never    Past Medical History, Surgical history, Social history, and Family history were reviewed and updated as appropriate.   Please see review of systems for further details on the patient's review from today.   Objective:   Physical Exam:  There were no vitals taken for this visit.  Physical Exam Constitutional:      General: Anne Marks is not in acute distress. Musculoskeletal:        General: No deformity.  Neurological:  Mental Status: Anne Marks is alert and oriented to person, place, and time.     Coordination: Coordination normal.  Psychiatric:        Attention and Perception: Attention and perception normal. Evone does not perceive auditory or visual hallucinations.        Mood and Affect: Affect is not labile, blunt, angry or inappropriate.        Speech: Speech normal.        Behavior: Behavior normal.        Thought Content: Thought content normal. Thought content is not paranoid or delusional. Thought content does not include homicidal or suicidal ideation. Thought content does not include homicidal or suicidal plan.        Cognition and Memory: Cognition and memory normal.        Judgment: Judgment normal.     Comments: Insight intact     Lab Review:     Component Value Date/Time   NA 140 07/18/2017 1010   K 4.3 07/18/2017 1010   CL 106 07/18/2017 1010   CO2 26 07/18/2017 1010   GLUCOSE 111 (H) 07/18/2017 1010   BUN 9 07/18/2017 1010   CREATININE 0.71 07/18/2017 1010   CALCIUM 9.4 07/18/2017 1010   PROT 7.6 07/18/2017 1010   ALBUMIN 3.5 07/18/2017 1010   AST 14 (L) 07/18/2017 1010   ALT 19 07/18/2017 1010   ALKPHOS 84 07/18/2017 1010   BILITOT 0.5  07/18/2017 1010   GFRNONAA >60 07/18/2017 1010   GFRAA >60 07/18/2017 1010       Component Value Date/Time   WBC 9.7 07/18/2017 1010   RBC 4.51 07/18/2017 1010   HGB 12.0 07/18/2017 1010   HCT 38.5 07/18/2017 1010   PLT 459 (H) 07/18/2017 1010   MCV 85.4 07/18/2017 1010   MCH 26.6 07/18/2017 1010   MCHC 31.2 07/18/2017 1010   RDW 14.3 07/18/2017 1010   LYMPHSABS 2.0 07/18/2017 1010   MONOABS 0.5 07/18/2017 1010   EOSABS 0.1 07/18/2017 1010   BASOSABS 0.0 07/18/2017 1010    No results found for: "POCLITH", "LITHIUM"   No results found for: "PHENYTOIN", "PHENOBARB", "VALPROATE", "CBMZ"   .res Assessment: Plan:   Plan:  Started on iron and Vitamin D  PDMP reviewed  Adderall XR 20mg  every morning   Wellbutrin XL 150mg  every morning Lamictal 150mg  daily  Xanax 0.5mg  daily  Prazosin 1mg  capsule - 1 to 2 at bedtime as needed Trazadone 50mg  - 1 to 2 at hs as needed  RTC 3 months  25 minutes spent dedicated to the care of this patient on the date of this encounter to include pre-visit review of records, ordering of medication, post visit documentation, and face-to-face time with the patient discussing ADD, BPD-2, GAD, and PTSD. Discussed continuing current medication regimen.  Patient advised to contact office with any questions, adverse effects, or acute worsening in signs and symptoms.   Discussed potential benefits, risk, and side effects of benzodiazepines to include potential risk of tolerance and dependence, as well as possible drowsiness.  Advised patient not to drive if experiencing drowsiness and to take lowest possible effective dose to minimize risk of dependence and tolerance.   Counseled patient regarding potential benefits, risks, and side effects of Lamictal to include potential risk of Stevens-Johnson syndrome. Advised patient to stop taking Lamictal and contact office immediately if rash develops and to seek urgent medical attention if rash is severe and/or  spreading quickly.   Discussed potential benefits, risks, and side effects of  stimulants with patient to include increased heart rate, palpitations, insomnia, increased anxiety, increased irritability, or decreased appetite.  Instructed patient to contact office if experiencing any significant tolerability issues.   There are no diagnoses linked to this encounter.   Please see After Visit Summary for patient specific instructions.  Future Appointments  Date Time Provider Department Center  05/03/2023  8:30 AM Mounir Skipper, Thereasa Solo, NP [facility] None  05/08/2023 10:00 AM [name], [facility] None  05/22/2023 10:00 AM [name], [facility] None  06/05/2023 10:00 AM [name], [facility] None  06/19/2023 10:00 AM [name], [facility] None    No orders of the defined types were placed in this encounter.     -------------------------------

## 2023-05-08 ENCOUNTER — Ambulatory Visit: Payer: BC Managed Care – PPO | Admitting: Behavioral Health

## 2023-05-08 DIAGNOSIS — F319 Bipolar disorder, unspecified: Secondary | ICD-10-CM | POA: Diagnosis not present

## 2023-05-08 DIAGNOSIS — F3181 Bipolar II disorder: Secondary | ICD-10-CM

## 2023-05-08 NOTE — Progress Notes (Signed)
 [facility] Counselor/Therapist Progress Note  Patient ID: Anne Marks, MRN: [id number],    Date: 05/08/2023 Length of session: 10:07 AM until 11:00 AM, 53 minutes Time Spent: This session was held via video teletherapy. The patient consented to the video teletherapy and was located in her home during this session. She is aware it is the responsibility of the patient to secure confidentiality on her end of the session. The provider was in his outpatient therapy office for the duration of this session.      Treatment Type: Individual Therapy  Reported Symptoms: anxiety, depression  Mental Status Exam: Appearance:  Well Groomed     Behavior: Appropriate  Motor: Normal  Speech/Language:  Clear and Coherent  Affect: Appropriate  Mood: normal  Thought process: normal  Thought content:   WNL  Sensory/Perceptual disturbances:   WNL  Orientation: oriented to person, place, time/date, situation, day of week, month of year, and year  Attention: Good  Concentration: Good  Memory: WNL  Fund of knowledge:  Good  Insight:   Good  Judgment:  Good  Impulse Control: Good   Risk Assessment: Danger to Self:  No Self-injurious Behavior: No Danger to Others: No Duty to Warn:no Physical Aggression / Violence:No  Access to Firearms a concern: No  Gang Involvement:No   Subjective: The patient has been more social lately.  She spent this weekend with friends, kind convention and had a great time.  She spent a long weekend prior to that with her biological daughter visiting with friends and with family and had a great time.  The patient focused on a few things today.  The first was a frustration with a coworker who has difficulty setting boundaries with his patients and that has played into the patient's practice which she does not appreciate.  She has stress that with him directly and made her manager aware of that.  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  She also had another conversation with the person that she used to date who had inquired recently if she thought they could get together again.  She says she is very clear that cannot happen and is wondering about her choices of who she dates and that they come into relationship in a very unhealthy way or with unhealthy background she questions why she is choosing people like that when she feels that she has worked hard to become healthier.  Begin to look at what some of her initial preferences are and how she starts into a relationship and how that can look different moving forward.  She also is very frustrated specifically with the father of her biological child.  The patient knew she made the best choice for herself and the child by giving her up for adoption is very thankful that the adoptive parents allow her time with her child.  The biological father's parents blame her for the adoption when they had made no effort to be involved in her life and they could do so.  She is especially angry at the biological father who makes no effort to be a part of her child's life when he had plenty of opportunity even on her most recent visit.  Part of that was part by question that her biological daughter asked her about the child's biological father and why he did not make an effort to be with her.  She says that she has been  honest with her as much as she can and 33-year-old level but does not fully understand it and has had multiple conversations with him about why he is not engaged with her. We will continue to process her frustrations with that situation in the next session. The patient does contract for safety having no thoughts of hurting herself or anyone else. Target date: October 15, 2022.  I reviewed the treatment goals with the patient and she agreed to continue to with them as stated with an extended target  date of November 07, 2023. Interventions:  Cognitive Behavioral  Therapy Diagnosis:Bi-Polar d/O Progress: 40% Plan: F/U appointments scheduled.  The patient prefers to meet every other week via video session.  Anne Marks, [facility]                                                                                                                   Anne Marks, [facility]    Anne Marks, [facility]

## 2023-05-22 ENCOUNTER — Ambulatory Visit (INDEPENDENT_AMBULATORY_CARE_PROVIDER_SITE_OTHER): Payer: BC Managed Care – PPO | Admitting: Behavioral Health

## 2023-05-22 ENCOUNTER — Encounter: Payer: Self-pay | Admitting: Behavioral Health

## 2023-05-22 DIAGNOSIS — F3181 Bipolar II disorder: Secondary | ICD-10-CM

## 2023-05-22 DIAGNOSIS — F319 Bipolar disorder, unspecified: Secondary | ICD-10-CM

## 2023-05-22 NOTE — Progress Notes (Signed)
 [facility] Counselor/Therapist Progress Note  Patient ID: Anne Marks, MRN: [id number],    Date: 05/22/2023 Length of session: 10:01 AM until 10:59 AM, 58 minutes Time Spent: This session was held via video teletherapy. The patient consented to the video teletherapy and was located in her home during this session. She is aware it is the responsibility of the patient to secure confidentiality on her end of the session. The provider was in his outpatient therapy office for the duration of this session.      Treatment Type: Individual Therapy  Reported Symptoms: anxiety, depression  Mental Status Exam: Appearance:  Well Groomed     Behavior: Appropriate  Motor: Normal  Speech/Language:  Clear and Coherent  Affect: Appropriate  Mood: normal  Thought process: normal  Thought content:   WNL  Sensory/Perceptual disturbances:   WNL  Orientation: oriented to person, place, time/date, situation, day of week, month of year, and year  Attention: Good  Concentration: Good  Memory: WNL  Fund of knowledge:  Good  Insight:   Good  Judgment:  Good  Impulse Control: Good   Risk Assessment: Danger to Self:  No Self-injurious Behavior: No Danger to Others: No Duty to Warn:no Physical Aggression / Violence:No  Access to Firearms a concern: No  Gang Involvement:No   Subjective: The patient has been extremely busy at work during things in addition to seeing patients and recognizes that she has too much.  She is addressing it with her manager this afternoon to go back on some of that extra specific responsibility that she has.  She also had a meeting with her manager and the peer who kept crossing boundaries with the patient and said that situation has been handled.  She recognized the fact that she pours herself and taking work or something else as a way of avoidance and a friend she had not seen in a while pointed out that the patient has done that as long  as she has known her for 15 years.  The patient says she has been thinking about but has not fully processed for different things.  One is that she can use avoidance and relationships.  The second is that she puts value in being a mother and wants to be a mother again but thinks that others might only value her if she was a mother.  She also recognizes a need for more movement active exercise.  The last which was briefly started working with the fear of bringing things up for fear of judgment thinking that if something went wrong she would be fearful of doing it again.  Part of that was conversation with her mother about her recent break-up.  We started looking with some of her fears are about sharing and what she thinks could go wrong.  We started processing more some of those roots into that and asked her to start looking at her and she felt she could start to make small changes.   The patient does contract for safety having no thoughts of hurting herself or anyone else. Target date: October 15, 2022.  I reviewed the treatment goals with the patient and she agreed to continue to with them as stated with an extended target  date of November 07, 2023. 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  The patient prefers to meet every other week via video session.  Tasia Catchings  Kelly Patient, [facility]                                                                                                                   Cecile Coder, [facility]    Cecile Coder, [facility]               Cecile Coder, [facility]

## 2023-06-05 ENCOUNTER — Ambulatory Visit (INDEPENDENT_AMBULATORY_CARE_PROVIDER_SITE_OTHER): Payer: BC Managed Care – PPO | Admitting: Behavioral Health

## 2023-06-05 ENCOUNTER — Encounter: Payer: Self-pay | Admitting: Behavioral Health

## 2023-06-05 DIAGNOSIS — F319 Bipolar disorder, unspecified: Secondary | ICD-10-CM | POA: Diagnosis not present

## 2023-06-05 DIAGNOSIS — F3181 Bipolar II disorder: Secondary | ICD-10-CM

## 2023-06-05 NOTE — Progress Notes (Signed)
 [facility] Counselor/Therapist Progress Note  Patient ID: Anne Marks, MRN: [id number],    Date: 06/05/2023 Length of session: 10:01 AM until 10:59 AM, 58 minutes Time Spent: This session was held via video teletherapy. The patient consented to the video teletherapy and was located in her home during this session. She is aware it is the responsibility of the patient to secure confidentiality on her end of the session. The provider was in his outpatient therapy office for the duration of this session.      Treatment Type: Individual Therapy  Reported Symptoms: anxiety, depression  Mental Status Exam: Appearance:  Well Groomed     Behavior: Appropriate  Motor: Normal  Speech/Language:  Clear and Coherent  Affect: Appropriate  Mood: normal  Thought process: normal  Thought content:   WNL  Sensory/Perceptual disturbances:   WNL  Orientation: oriented to person, place, time/date, situation, day of week, month of year, and year  Attention: Good  Concentration: Good  Memory: WNL  Fund of knowledge:  Good  Insight:   Good  Judgment:  Good  Impulse Control: Good   Risk Assessment: Danger to Self:  No Self-injurious Behavior: No Danger to Others: No Duty to Warn:no Physical Aggression / Violence:No  Access to Firearms a concern: No  Gang Involvement:No   Subjective: The patient is recognizing that work has become stressful and has addressed that with her manager who is working with her to cut back on some of what she is doing.  She is setting some better boundaries also.  She also has a friend coming into town in a few weeks and is taking a few days off to spend with her friend.  The patient says she has been wrestling with what she knows and is somewhat depression and loneliness.  Intellectually she understands that she is working hard on knowing what she wants in a healthy relationship and acknowledged how hard she has worked to get healthier  but says her heart and her head are not always connecting and that can feel uncomfortable at times.  He is recognizing that in the past relationships because she is very caring she always wants to make things better and fix things and knows she cannot do that anymore talk about how she can learn to let go of that.  We also talked about in a healthy relationship recognizing that her partner will want to put in and support her as much as she supports them.  She acknowledges some discouragement but also says she wants to continue to be hopeful that she can find and be in a healthy relationship. The patient does contract for safety having no thoughts of hurting herself or anyone else. Target date: October 15, 2022.  I reviewed the treatment goals with the patient and she agreed to continue to with them as stated with an extended target  date of November 07, 2023. 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  The patient prefers to meet every other week via video session.  Cecile Coder, [facility]  Cecile Coder, [facility]    Cecile Coder, [facility]               Cecile Coder, [facility]               Cecile Coder, [facility]

## 2023-06-19 ENCOUNTER — Encounter: Payer: Self-pay | Admitting: Behavioral Health

## 2023-06-19 ENCOUNTER — Ambulatory Visit (INDEPENDENT_AMBULATORY_CARE_PROVIDER_SITE_OTHER): Payer: BC Managed Care – PPO | Admitting: Behavioral Health

## 2023-06-19 DIAGNOSIS — F319 Bipolar disorder, unspecified: Secondary | ICD-10-CM | POA: Diagnosis not present

## 2023-06-19 NOTE — Progress Notes (Signed)
 [facility] Counselor/Therapist Progress Note  Patient ID: Anne Marks, MRN: [id number],    Date: 06/19/2023 Length of session: 10:01 AM until 10:59 AM, 58 minutes Time Spent: This session was held via video teletherapy. The patient consented to the video teletherapy and was located in her home during this session. She is aware it is the responsibility of the patient to secure confidentiality on her end of the session. The provider was in his outpatient therapy office for the duration of this session.      Treatment Type: Individual Therapy  Reported Symptoms: anxiety, depression  Mental Status Exam: Appearance:  Well Groomed     Behavior: Appropriate  Motor: Normal  Speech/Language:  Clear and Coherent  Affect: Appropriate  Mood: normal  Thought process: normal  Thought content:   WNL  Sensory/Perceptual disturbances:   WNL  Orientation: oriented to person, place, time/date, situation, day of week, month of year, and year  Attention: Good  Concentration: Good  Memory: WNL  Fund of knowledge:  Good  Insight:   Good  Judgment:  Good  Impulse Control: Good   Risk Assessment: Danger to Self:  No Self-injurious Behavior: No Danger to Others: No Duty to Warn:no Physical Aggression / Violence:No  Access to Firearms a concern: No  Gang Involvement:No   Subjective: The patient did spend Mother's Day with her mother father and siblings.  The patient is very dedicated to caring for her parents and arrange a very nice lunch for the him asking her sister to contribute very little and said that even with that they raised her frustration level because of their lack of participation.  Her parents appreciated the efforts that she made to have recognition of the difference in the efforts she makes versus her siblings.  She is working hard to manage her frustrations with her siblings and set healthy boundaries.  She is taking today and tomorrow off for some mental health  days and does has plans for self-care including gaining some with friends of hers.  She is working to maintain her friendship with a guy that she broke up with a few months ago but indicates continued frustration with his lack of understanding.  She said that was a very difficult conversation over the weekend in which she had to set a very clear boundaries.  Things were talked about afterwards and settle down some but she said he is doing some of the same mistakes that he did when they were dating and she tried to help him see through.  We talked about how much she wants to be involved in that relationship in terms of helping him but also expecting him to make changes for his own better self. The patient does contract for safety having no thoughts of hurting herself or anyone else. Target date: October 15, 2022.  I reviewed the treatment goals with the patient and she agreed to continue to with them as stated with an extended target  date of November 07, 2023. 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  The patient prefers to meet every other week via video session.  Cecile Coder, [facility]  Cecile Coder, [facility]    Cecile Coder, [facility]               Cecile Coder, [facility]               Cecile Coder, [facility]               Cecile Coder, [facility]

## 2023-07-17 ENCOUNTER — Ambulatory Visit: Admitting: Behavioral Health

## 2023-07-31 ENCOUNTER — Ambulatory Visit (INDEPENDENT_AMBULATORY_CARE_PROVIDER_SITE_OTHER): Admitting: Behavioral Health

## 2023-07-31 DIAGNOSIS — F319 Bipolar disorder, unspecified: Secondary | ICD-10-CM | POA: Diagnosis not present

## 2023-07-31 DIAGNOSIS — F3181 Bipolar II disorder: Secondary | ICD-10-CM

## 2023-07-31 NOTE — Progress Notes (Signed)
 [facility] Counselor/Therapist Progress Note  Patient ID: Anne Marks, MRN: [id number],    Date: 07/31/2023 Length of session: 10:01 AM until 10:59 AM, 58 minutes Time Spent: This session was held via video teletherapy. The patient consented to the video teletherapy and was located in her home during this session. She is aware it is the responsibility of the patient to secure confidentiality on her end of the session. The provider was in his outpatient therapy office for the duration of this session.      Treatment Type: Individual Therapy  Reported Symptoms: anxiety, depression  Mental Status Exam: Appearance:  Well Groomed     Behavior: Appropriate  Motor: Normal  Speech/Language:  Clear and Coherent  Affect: Appropriate  Mood: normal  Thought process: normal  Thought content:   WNL  Sensory/Perceptual disturbances:   WNL  Orientation: oriented to person, place, time/date, situation, day of week, month of year, and year  Attention: Good  Concentration: Good  Memory: WNL  Fund of knowledge:  Good  Insight:   Good  Judgment:  Good  Impulse Control: Good   Risk Assessment: Danger to Self:  No Self-injurious Behavior: No Danger to Others: No Duty to Warn:no Physical Aggression / Violence:No  Access to Firearms a concern: No  Gang Involvement:No   Subjective: The patient is concerned about her father.  He has already been diagnosed with Parkinson's disease but he recently told her that he had prostate cancer.  Thankfully he is very early and treatable and he will find out early next week the best course of treatment but she feels like it will be radiation.  She had had a very difficult conversation with her siblings about being more engaged in their mother and father's care.  She also found out some interesting information about the father of her child.  That has led her to set a very healthy solid out boundary which she recognized she  should have done earlier.  Some pretty strong emotions which we processed in the session and I encouraged her to continue to set social verbal emotional conversational boundaries but emphasized the importance of challenging thoughts related to the history of that relationship.  We also looked at a relationship with a friend of hers and what her expectations are for that relationship and what her boundaries will be in that relationship also.  The patient does contract for safety having no thoughts of hurting herself or anyone else. Target date: October 15, 2022.  I reviewed the treatment goals with the patient and she agreed to continue to with them as stated with an extended target  date of November 07, 2023. 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  The patient prefers to meet every other week via video session.  Lorrene CHRISTELLA Hasten, [facility]  Lorrene CHRISTELLA Hasten, [facility]    Lorrene CHRISTELLA Hasten, [facility]               Lorrene CHRISTELLA Hasten, [facility]               Lorrene CHRISTELLA Hasten, [facility]               Lorrene CHRISTELLA Hasten, [facility]       Lorrene CHRISTELLA Hasten, [facility]

## 2023-08-02 ENCOUNTER — Telehealth: Admitting: Adult Health

## 2023-08-02 ENCOUNTER — Encounter: Payer: Self-pay | Admitting: Adult Health

## 2023-08-02 DIAGNOSIS — F411 Generalized anxiety disorder: Secondary | ICD-10-CM | POA: Diagnosis not present

## 2023-08-02 DIAGNOSIS — F900 Attention-deficit hyperactivity disorder, predominantly inattentive type: Secondary | ICD-10-CM | POA: Diagnosis not present

## 2023-08-02 DIAGNOSIS — F4310 Post-traumatic stress disorder, unspecified: Secondary | ICD-10-CM

## 2023-08-02 DIAGNOSIS — F3181 Bipolar II disorder: Secondary | ICD-10-CM | POA: Diagnosis not present

## 2023-08-02 MED ORDER — LAMOTRIGINE 150 MG PO TABS: 150.0000 mg | ORAL_TABLET | Freq: Every day | ORAL | 1 refills | Status: AC

## 2023-08-02 MED ORDER — AMPHETAMINE-DEXTROAMPHET ER 20 MG PO CP24: 20.0000 mg | ORAL_CAPSULE | ORAL | 0 refills | Status: DC

## 2023-08-02 MED ORDER — ALPRAZOLAM 0.5 MG PO TABS: 0.5000 mg | ORAL_TABLET | Freq: Every day | ORAL | 2 refills | Status: DC | PRN

## 2023-08-02 MED ORDER — AMPHETAMINE-DEXTROAMPHET ER 20 MG PO CP24: 20.0000 mg | ORAL_CAPSULE | Freq: Every day | ORAL | 0 refills | Status: DC

## 2023-08-02 MED ORDER — PRAZOSIN HCL 2 MG PO CAPS: ORAL_CAPSULE | ORAL | 1 refills | Status: AC

## 2023-08-02 MED ORDER — TRAZODONE HCL 50 MG PO TABS: 50.0000 mg | ORAL_TABLET | Freq: Every day | ORAL | 1 refills | Status: AC

## 2023-08-02 MED ORDER — BUPROPION HCL ER (XL) 150 MG PO TB24: 150.0000 mg | ORAL_TABLET | Freq: Every day | ORAL | 1 refills | Status: AC

## 2023-08-02 NOTE — Progress Notes (Signed)
 [name] [id number] 1991/01/10 33 y.o.  Virtual Visit via Video Note  I connected with pt @ on 08/02/23 at  9:00 AM EDT by a video enabled telemedicine application and verified that I am speaking with the correct person using two identifiers.   I discussed the limitations of evaluation and management by telemedicine and the availability of in person appointments. The patient expressed understanding and agreed to proceed.  I discussed the assessment and treatment plan with the patient. The patient was provided an opportunity to ask questions and all were answered. The patient agreed with the plan and demonstrated an understanding of the instructions.   The patient was advised to call back or seek an in-person evaluation if the symptoms worsen or if the condition fails to improve as anticipated.  I provided 25 minutes of non-face-to-face time during this encounter.  The patient was located at home.  The provider was located at [facility].   Angeline LOISE Sayers, NP   Subjective:   Patient ID:  Anne Marks is a 33 y.o. (DOB [birth year]) adult.  Chief Complaint: No chief complaint on file.   HPI Anne Marks presents for follow-up of ADD, BPD-2, GAD, and PTSD.  Describes mood today as ok. Pleasant. Denies tearfulness. Mood symptoms - reports recent situational depression Reports anxiety - it's gotten a little worse. Reports feeling stressed with work and family obligations. Reports irritability - feeling overwhelmed and the heat. Reports stable interest. Reports lower motivation - it's minimal. Denies panic attacks. Denies worry. Reports situational rumination and over thinking. Reports decreased cyclical thinking. Reports mood as consistent. Stating I feel like I'm doing good. Taking medications as prescribed.  Energy levels stable. Active, exercises twice a week.  Enjoys some usual interests and activities. Lives with a rommate. Spending time with family and  friends. Appetite adequate - some stress eating. Reports gradual weight gain. Reports sleep has declined - stress and heat. Averages 5 to 6 hours. Focus and concentration stable. Completing tasks. Managing aspects of household. Working full time - therapist. Denies SI or HI.  Denies AH or VH. Denies self harm. Denies substance use. Working with a Paramedic.  Previous medication trials: Buspar, Zoloft   Review of Systems:  Review of Systems  Musculoskeletal:  Negative for gait problem.  Neurological:  Negative for tremors.  Psychiatric/Behavioral:         Please refer to HPI    Medications: I have reviewed the patient's current medications.  Current Outpatient Medications  Medication Sig Dispense Refill   ALPRAZolam  (XANAX ) 0.5 MG tablet Take 1 tablet (0.5 mg total) by mouth daily as needed for anxiety. 30 tablet 2   amphetamine -dextroamphetamine  (ADDERALL XR) 20 MG 24 hr capsule Take 1 capsule (20 mg total) by mouth every morning. 30 capsule 0   amphetamine -dextroamphetamine  (ADDERALL XR) 20 MG 24 hr capsule Take 1 capsule (20 mg total) by mouth daily. 300 capsule 0   amphetamine -dextroamphetamine  (ADDERALL XR) 20 MG 24 hr capsule Take 1 capsule (20 mg total) by mouth daily. 30 capsule 0   buPROPion  (WELLBUTRIN  XL) 150 MG 24 hr tablet Take 1 tablet (150 mg total) by mouth daily. 30 tablet 5   ferrous sulfate  325 (65 FE) MG EC tablet Take 1 tablet (325 mg total) by mouth 2 (two) times daily. 60 tablet 3   ibuprofen  (ADVIL ,MOTRIN ) 600 MG tablet Take 1 tablet (600 mg total) by mouth every 6 (six) hours. 30 tablet 0   lamoTRIgine  (LAMICTAL ) 150 MG tablet  Take 1 tablet (150 mg total) by mouth daily. 30 tablet 5   prazosin  (MINIPRESS ) 1 MG capsule Take one to two tablets at bedtime. 60 capsule 2   Prenatal Vit-Fe Fumarate-FA (PRENATAL VITAMIN PO) Take by mouth.     traZODone  (DESYREL ) 50 MG tablet Take 1 tablet (50 mg total) by mouth at bedtime. 90 tablet 0   No current  facility-administered medications for this visit.    Medication Side Effects: None  Allergies: No Known Allergies  Past Medical History:  Diagnosis Date   Bipolar 1 disorder (HCC)    Former smoker    Mental disorder     No family history on file.  Social History   Socioeconomic History   Marital status: Single    Spouse name: Not on file   Number of children: Not on file   Years of education: Not on file   Highest education level: Not on file  Occupational History   Not on file  Tobacco Use   Smoking status: Former    Current packs/day: 0.00    Types: Cigarettes    Quit date: 10/09/2011    Years since quitting: 11.8   Smokeless tobacco: Never  Substance and Sexual Activity   Alcohol use: Yes    Comment: a weekedn a month ; not while pregnant   Drug use: No   Sexual activity: Yes    Comment: one week ago  Other Topics Concern   Not on file  Social History Narrative   Not on file   Social Drivers of Health   Financial Resource Strain: Low Risk  (02/11/2022)   Received from [facility]   Overall Financial Resource Strain (CARDIA)    Difficulty of Paying Living Expenses: Not very hard  Food Insecurity: Food Insecurity Present (02/11/2022)   [facility] from [facility]   Hunger Vital Sign    Within the past 12 months, you worried that your food would run out before you got the money to buy more.: Sometimes true    Within the past 12 months, the food you bought just didn't last and you didn't have money to get more.: Patient declined  Transportation Needs: No Transportation Needs (02/11/2022)   Received from [facility] - Transportation    Lack of Transportation (Medical): No    Lack of Transportation (Non-Medical): No  Physical Activity: Insufficiently Active (02/11/2022)   Received from [facility]   Exercise Vital Sign    On average, how many days per week do you engage in moderate to strenuous exercise (like a brisk walk)?: 1 day    On average, how  many minutes do you engage in exercise at this level?: 10 min  Stress: No Stress Concern Present (02/11/2022)   Received from [facility] - Occupational Stress Questionnaire    Feeling of Stress : Only a little  Social Connections: Socially Integrated (02/11/2022)   Received from [facility]   Social Network    How would you rate your social network (family, work, friends)?: Good participation with social networks  Intimate Partner Violence: Not At Risk (02/11/2022)   Received from [facility]   HITS    Over the last 12 months how often did your partner physically hurt you?: Never    Over the last 12 months how often did your partner insult you or talk down to you?: Never    Over the last 12 months how often did your partner  threaten you with physical harm?: Never    Over the last 12 months how often did your partner scream or curse at you?: Never    Past Medical History, Surgical history, Social history, and Family history were reviewed and updated as appropriate.   Please see review of systems for further details on the patient's review from today.   Objective:   Physical Exam:  There were no vitals taken for this visit.  Physical Exam Constitutional:      General: Anne Marks is not in acute distress.  Musculoskeletal:        General: No deformity.   Neurological:     Mental Status: Anne Marks is alert and oriented to person, place, and time.     Coordination: Coordination normal.   Psychiatric:        Attention and Perception: Attention and perception normal. Anne Marks does not perceive auditory or visual hallucinations.        Mood and Affect: Mood normal. Mood is not anxious or depressed. Affect is not labile, blunt, angry or inappropriate.        Speech: Speech normal.        Behavior: Behavior normal.        Thought Content: Thought content normal. Thought content is not paranoid or delusional. Thought content does not include homicidal or  suicidal ideation. Thought content does not include homicidal or suicidal plan.        Cognition and Memory: Cognition and memory normal.        Judgment: Judgment normal.     Comments: Insight intact     Lab Review:     Component Value Date/Time   NA 140 07/18/2017 1010   K 4.3 07/18/2017 1010   CL 106 07/18/2017 1010   CO2 26 07/18/2017 1010   GLUCOSE 111 (H) 07/18/2017 1010   BUN 9 07/18/2017 1010   CREATININE 0.71 07/18/2017 1010   CALCIUM 9.4 07/18/2017 1010   PROT 7.6 07/18/2017 1010   ALBUMIN 3.5 07/18/2017 1010   AST 14 (L) 07/18/2017 1010   ALT 19 07/18/2017 1010   ALKPHOS 84 07/18/2017 1010   BILITOT 0.5 07/18/2017 1010   GFRNONAA >60 07/18/2017 1010   GFRAA >60 07/18/2017 1010       Component Value Date/Time   WBC 9.7 07/18/2017 1010   RBC 4.51 07/18/2017 1010   HGB 12.0 07/18/2017 1010   HCT 38.5 07/18/2017 1010   PLT 459 (H) 07/18/2017 1010   MCV 85.4 07/18/2017 1010   MCH 26.6 07/18/2017 1010   MCHC 31.2 07/18/2017 1010   RDW 14.3 07/18/2017 1010   LYMPHSABS 2.0 07/18/2017 1010   MONOABS 0.5 07/18/2017 1010   EOSABS 0.1 07/18/2017 1010   BASOSABS 0.0 07/18/2017 1010    No results found for: POCLITH, LITHIUM   No results found for: PHENYTOIN, PHENOBARB, VALPROATE, CBMZ   .res Assessment: Plan:    Plan:  PDMP reviewed  Adderall XR 20mg  every morning   Wellbutrin  XL 150mg  every morning Lamictal  150mg  daily  Xanax  0.5mg  daily  Prazosin  1mg  capsule - 1 to 2 at bedtime as needed Trazadone 50mg  - 1 to 2 at hs as needed  RTC 3 months  25 minutes spent dedicated to the care of this patient on the date of this encounter to include pre-visit review of records, ordering of medication, post visit documentation, and face-to-face time with the patient discussing ADD, BPD-2, GAD and PTSD. Discussed continuing current medication regimen.  Patient advised to contact office with any  questions, adverse effects, or acute worsening in signs  and symptoms.   Discussed potential benefits, risk, and side effects of benzodiazepines to include potential risk of tolerance and dependence, as well as possible drowsiness.  Advised patient not to drive if experiencing drowsiness and to take lowest possible effective dose to minimize risk of dependence and tolerance.   Counseled patient regarding potential benefits, risks, and side effects of Lamictal  to include potential risk of Stevens-Johnson syndrome. Advised patient to stop taking Lamictal  and contact office immediately if rash develops and to seek urgent medical attention if rash is severe and/or spreading quickly.   Discussed potential benefits, risks, and side effects of stimulants with patient to include increased heart rate, palpitations, insomnia, increased anxiety, increased irritability, or decreased appetite.  Instructed patient to contact office if experiencing any significant tolerability issues.   There are no diagnoses linked to this encounter.   Please see After Visit Summary for patient specific instructions.  Future Appointments  Date Time Provider Department Center  08/02/2023  9:00 AM Yarnell Arvidson, Angeline Mattocks, NP [facility] None  08/14/2023 10:00 AM Zulema Lorrene HERO, [facility] None  08/28/2023 10:00 AM Zulema Lorrene HERO, [facility] None  09/11/2023 10:00 AM Zulema Lorrene HERO, [facility] None  09/25/2023 10:00 AM Zulema Lorrene HERO, [facility] None    No orders of the defined types were placed in this encounter.     -------------------------------

## 2023-08-14 ENCOUNTER — Ambulatory Visit (INDEPENDENT_AMBULATORY_CARE_PROVIDER_SITE_OTHER): Admitting: Behavioral Health

## 2023-08-14 ENCOUNTER — Encounter: Payer: Self-pay | Admitting: Behavioral Health

## 2023-08-14 DIAGNOSIS — F319 Bipolar disorder, unspecified: Secondary | ICD-10-CM | POA: Diagnosis not present

## 2023-08-14 NOTE — Progress Notes (Signed)
 [facility] Counselor/Therapist Progress Note  Patient ID: Anne Marks, MRN: [id number],    Date: 08/14/2023 Length of session: 10:05 AM until 10:58 AM, 53 minutes Time Spent: This session was held via video teletherapy. The patient consented to the video teletherapy and was located in her home during this session. She is aware it is the responsibility of the patient to secure confidentiality on her end of the session. The provider was in his outpatient therapy office for the duration of this session.      Treatment Type: Individual Therapy  Reported Symptoms: anxiety, depression  Mental Status Exam: Appearance:  Well Groomed     Behavior: Appropriate  Motor: Normal  Speech/Language:  Clear and Coherent  Affect: Appropriate  Mood: normal  Thought process: normal  Thought content:   WNL  Sensory/Perceptual disturbances:   WNL  Orientation: oriented to person, place, time/date, situation, day of week, month of year, and year  Attention: Good  Concentration: Good  Memory: WNL  Fund of knowledge:  Good  Insight:   Good  Judgment:  Good  Impulse Control: Good   Risk Assessment: Danger to Self:  No Self-injurious Behavior: No Danger to Others: No Duty to Warn:no Physical Aggression / Violence:No  Access to Firearms a concern: No  Gang Involvement:No   Subjective: The patient has been introspective in terms of patient's symptoms and the part she plays in them and the choices that she makes in terms of partners.  She sees a pattern of dating especially recently partners who have been knew her diversion whether it be on the autism spectrum or having ADD.  We looked at factors such as their cognitive intelligence, emotional intelligence and pattern as they are what she is looking forward in relationships.  We looked at communication styles and patterns.  She recognizes that there are certain things that she works on in terms of communication.  She says  if she brings something up and the partner does not expressed interest and she just also bring it up again or if she does something they do not like she shuts down that aspect of her life at least in that relationship.  She is working on improving that.  She also is very interested in other people's interest even if it is not hers and so she will take time to get to know them and that interest and does not understand why that is not reciprocated and says she does not feel that was necessarily reciprocated in any of the relationships of length that she has been in.When the partner does not express interest in her interest she shuts down and that impacts how she sees herself.  She says she knows that she can get that from when she helps but that is something that she needs and healthy dating relationships. She is aware that she is taking her time to look closely at relationships that she will be involved in because he wants to go into relationships with the intention of being with someone that she will marry she is being very introspective and in a healthy way critical about what she wants relationship look like in the part she plays in that.  The patient does contract for safety having no thoughts of hurting herself or anyone else. Target date: October 15, 2022.  I reviewed the treatment goals with the patient and she agreed to continue to with them as stated with an  extended target  date of November 07, 2023. 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  The patient prefers to meet every other week via video session.  Lorrene CHRISTELLA Hasten, [facility]                                                                                                                   Lorrene CHRISTELLA Hasten, [facility]    Lorrene CHRISTELLA Hasten,  [facility]               Lorrene CHRISTELLA Hasten, [facility]               Lorrene CHRISTELLA Hasten, [facility]               Lorrene CHRISTELLA Hasten, [facility]       Lorrene CHRISTELLA Hasten, [facility]               Lorrene CHRISTELLA Hasten, [facility]

## 2023-08-28 ENCOUNTER — Ambulatory Visit (INDEPENDENT_AMBULATORY_CARE_PROVIDER_SITE_OTHER): Admitting: Behavioral Health

## 2023-08-28 ENCOUNTER — Encounter: Payer: Self-pay | Admitting: Behavioral Health

## 2023-08-28 DIAGNOSIS — F3181 Bipolar II disorder: Secondary | ICD-10-CM

## 2023-08-28 NOTE — Progress Notes (Signed)
 [facility] Counselor/Therapist Progress Note  Patient ID: Anne Marks, MRN: [id number],    Date: 08/28/2023 Length of session: 10:03 AM until 11:00 AM, 57 minutes Time Spent: This session was held via video teletherapy. The patient consented to the video teletherapy and was located in her home during this session. She is aware it is the responsibility of the patient to secure confidentiality on her end of the session. The provider was in his outpatient therapy office for the duration of this session.      Treatment Type: Individual Therapy  Reported Symptoms: anxiety, depression  Mental Status Exam: Appearance:  Well Groomed     Behavior: Appropriate  Motor: Normal  Speech/Language:  Clear and Coherent  Affect: Appropriate  Mood: normal  Thought process: normal  Thought content:   WNL  Sensory/Perceptual disturbances:   WNL  Orientation: oriented to person, place, time/date, situation, day of week, month of year, and year  Attention: Good  Concentration: Good  Memory: WNL  Fund of knowledge:  Good  Insight:   Good  Judgment:  Good  Impulse Control: Good   Risk Assessment: Danger to Self:  No Self-injurious Behavior: No Danger to Others: No Duty to Warn:no Physical Aggression / Violence:No  Access to Firearms a concern: No  Gang Involvement:No   Subjective: The patient is taken to pick steps in self-care that she went to the dentist for the first time in several years and really liked this in dental practice and the dentist.  Because of past experiences there had been significant anxiety and is thankful this for somewhat well.  There are a few small things that need to be done but none of which creates significant anxiety for her.  She also has been having significant pain and stiffness in her knees to the point of waking her up with pain at night so she did meet with the surgeon whom she also liked and is going to start some anti-inflammatories as  well as physical therapy as of her step and relief.  She had been having conversations with a guy that she had been dating and felt that they were making some headway in terms of working through some of the things that led to them not being together.  She felt the both of them are making efforts in the situation from his past complicated things.  She feels that he is trying to work through those and she is trying to be understanding and helpful but also sees this as being more than just a short time changed the circumstances or make them better.  She has made feelings about that and that she understands why he has to do with this but also has some sad her feelings because she felt like they were making some major steps toward reconciliation.  I encouraged her not to cut off that conversation but to continue to encourage him to work through those things to try to bring resolution as quickly as possible.  The patient does contract for safety having no thoughts of hurting herself or anyone else. Target date: October 15, 2022.  I reviewed the treatment goals with the patient and she agreed to continue to with them as stated with an extended target  date of November 07, 2023. 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  The patient prefers to meet every other week via video session.  Lorrene CHRISTELLA Hasten, [facility]  Lorrene CHRISTELLA Hasten, [facility]    Lorrene CHRISTELLA Hasten, [facility]               Lorrene CHRISTELLA Hasten, [facility]               Lorrene CHRISTELLA Hasten, [facility]               Lorrene CHRISTELLA Hasten, [facility]       Lorrene CHRISTELLA Hasten, [facility]               Lorrene CHRISTELLA Hasten,  [facility]               Lorrene CHRISTELLA Hasten, [facility]

## 2023-09-11 ENCOUNTER — Ambulatory Visit (INDEPENDENT_AMBULATORY_CARE_PROVIDER_SITE_OTHER): Admitting: Behavioral Health

## 2023-09-11 DIAGNOSIS — F319 Bipolar disorder, unspecified: Secondary | ICD-10-CM

## 2023-09-11 DIAGNOSIS — F3181 Bipolar II disorder: Secondary | ICD-10-CM

## 2023-09-11 NOTE — Progress Notes (Signed)
 [facility] Counselor/Therapist Progress Note  Patient ID: Anne Marks, MRN: [id number],    Date: 09/11/2023 Length of session: 10:00 AM until 11:00 AM, 60 minutes Time Spent: This session was held via video teletherapy. The patient consented to the video teletherapy and was located in her home during this session. She is aware it is the responsibility of the patient to secure confidentiality on her end of the session. The provider was in his outpatient therapy office for the duration of this session.      Treatment Type: Individual Therapy  Reported Symptoms: anxiety, depression  Mental Status Exam: Appearance:  Well Groomed     Behavior: Appropriate  Motor: Normal  Speech/Language:  Clear and Coherent  Affect: Appropriate  Mood: normal  Thought process: normal  Thought content:   WNL  Sensory/Perceptual disturbances:   WNL  Orientation: oriented to person, place, time/date, situation, day of week, month of year, and year  Attention: Good  Concentration: Good  Memory: WNL  Fund of knowledge:  Good  Insight:   Good  Judgment:  Good  Impulse Control: Good   Risk Assessment: Danger to Self:  No Self-injurious Behavior: No Danger to Others: No Duty to Warn:no Physical Aggression / Violence:No  Access to Firearms a concern: No  Gang Involvement:No   Subjective: The patient is tired.  She is taking the week off which she plans months ago but started recognizing especially several weeks ago and even more intensely last week that she is experiencing some burnout.  Some of the things that she needs to do work she has little to no motivation to do.  She finds herself being more irritable even with people that she does not want to be irritable with.  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  We broke down some of the things that are contributing to her stress and fatigue including looking at what she  can do differently to restructure work responsibilities.  There have been significant number of no-shows which means she does not get paid for those hours and that has created some financial stress.  She understands that is a part of the feel that she is in but it does not make it easier.  She had a great weekend with her daughter but something her daughter said inadvertently made her think about the father of her child and how frustrated at times he makes her about not engaging with their daughter.  She also knows that she cannot control that and knows that she is very good to her child.  The situation with a guy that she had been dating and talking to again took a turn and she is not sure where that will go out of that is out of her control which she recognizes.  Being in this field for years she is recognizing now the need for intentional time away from her job because she sees it spilling over into her personal life.  She said the last couple weeks how she has done his work eat and go to bed and she is missing out.  She is leaving tomorrow to go to [place]  to spend time with friends are getting married including the patient officiating the wedding as well as going to a coz play event.  Encouraged her to find some little things to do such as getting back to playing the keyboard, listening to books on demand,  spending time with her friends in the area but especially cutting back on some of the work responsibilities that she is having which take a lot of her time out are necessarily financially beneficial to her. The patient does contract for safety having no thoughts of hurting herself or anyone else. Target date: October 15, 2022.  I reviewed the treatment goals with the patient and she agreed to continue to with them as stated with an extended target  date of November 07, 2023. 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  The  patient prefers to meet every other week via video session.  Anne Marks, [facility]                                                                                                                   Anne Marks, [facility]    Anne Marks, [facility]               Anne Marks, [facility]               Anne Marks, [facility]               Anne Marks, [facility]       Anne Marks, [facility]               Anne Marks, [facility]               Anne Marks, [facility]               Anne Marks, [facility]

## 2023-09-25 ENCOUNTER — Encounter: Payer: Self-pay | Admitting: Behavioral Health

## 2023-09-25 ENCOUNTER — Ambulatory Visit (INDEPENDENT_AMBULATORY_CARE_PROVIDER_SITE_OTHER): Admitting: Behavioral Health

## 2023-09-25 DIAGNOSIS — F319 Bipolar disorder, unspecified: Secondary | ICD-10-CM | POA: Diagnosis not present

## 2023-09-25 DIAGNOSIS — F3181 Bipolar II disorder: Secondary | ICD-10-CM

## 2023-09-25 NOTE — Progress Notes (Signed)
 [facility] Counselor/Therapist Progress Note  Patient ID: Anne Marks, MRN: [id number],    Date: 09/25/2023 Length of session: 10:03 AM until 10:58 AM, 55 minutes Time Spent: This session was held via video teletherapy. The patient consented to the video teletherapy and was located in her home during this session. She is aware it is the responsibility of the patient to secure confidentiality on her end of the session. The provider was in his outpatient therapy office for the duration of this session.      Treatment Type: Individual Therapy  Reported Symptoms: anxiety, depression  Mental Status Exam: Appearance:  Well Groomed     Behavior: Appropriate  Motor: Normal  Speech/Language:  Clear and Coherent  Affect: Appropriate  Mood: normal  Thought process: normal  Thought content:   WNL  Sensory/Perceptual disturbances:   WNL  Orientation: oriented to person, place, time/date, situation, day of week, month of year, and year  Attention: Good  Concentration: Good  Memory: WNL  Fund of knowledge:  Good  Insight:   Good  Judgment:  Good  Impulse Control: Good   Risk Assessment: Danger to Self:  No Self-injurious Behavior: No Danger to Others: No Duty to Warn:no Physical Aggression / Violence:No  Access to Firearms a concern: No  Gang Involvement:No   Subjective: The vacation the patient had did not turn out to be as restful as she hoped.  She was able to spend some time with friends but some of the other things that took place including a very difficult conversation with her got she used to date.  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  She acknowledges that she recognizes her own frustration and need to step away from everything.  She says that she feels like a lot of people depend on her and she needs not  to be depended on for some time.  I strongly encouraged her to find at least a long week and when to go off grid at least letting her parents know where she is because of his health issues but otherwise to go take care of herself completely and not worried about taking care of anyone else.  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  The patient does contract for safety having no thoughts of hurting herself or anyone else. Target date: October 15, 2022.  I reviewed the treatment goals with the patient and she agreed to continue to with them as stated with an extended target  date of November 07, 2023. 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  The patient prefers to meet every other week via video session.  Lorrene CHRISTELLA Hasten, [facility]  Lorrene CHRISTELLA Hasten, [facility]    Lorrene CHRISTELLA Hasten, [facility]               Lorrene CHRISTELLA Hasten, [facility]               Lorrene CHRISTELLA Hasten, [facility]               Lorrene CHRISTELLA Hasten, [facility]       Lorrene CHRISTELLA Hasten, [facility]               Lorrene CHRISTELLA Hasten, [facility]               Lorrene CHRISTELLA Hasten, [facility]               Lorrene CHRISTELLA Hasten, [facility]               Lorrene CHRISTELLA Hasten, [facility]

## 2023-10-03 ENCOUNTER — Ambulatory Visit (INDEPENDENT_AMBULATORY_CARE_PROVIDER_SITE_OTHER): Admitting: Behavioral Health

## 2023-10-03 DIAGNOSIS — F319 Bipolar disorder, unspecified: Secondary | ICD-10-CM | POA: Diagnosis not present

## 2023-10-03 DIAGNOSIS — F3181 Bipolar II disorder: Secondary | ICD-10-CM

## 2023-10-03 NOTE — Progress Notes (Signed)
  [facility] Counselor/Therapist Progress Note  Patient ID: Anne Marks, MRN: [id number],    Date: 10/03/2023 Length of session: 12 PM until 12:33 PM, 33 minutes Time Spent: This session was held via video teletherapy. The patient consented to the video teletherapy and was located in her office during this session. She is aware it is the responsibility of the patient to secure confidentiality on her end of the session. The provider was in his outpatient therapy office for the duration of this session.      Treatment Type: Individual Therapy  Reported Symptoms: anxiety, depression  Mental Status Exam: Appearance:  Well Groomed     Behavior: Appropriate  Motor: Normal  Speech/Language:  Clear and Coherent  Affect: Appropriate  Mood: normal  Thought process: normal  Thought content:   WNL  Sensory/Perceptual disturbances:   WNL  Orientation: oriented to person, place, time/date, situation, day of week, month of year, and year  Attention: Good  Concentration: Good  Memory: WNL  Fund of knowledge:  Good  Insight:   Good  Judgment:  Good  Impulse Control: Good   Risk Assessment: Danger to Self:  No Self-injurious Behavior: No Danger to Others: No Duty to Warn:no Physical Aggression / Violence:No  Access to Firearms a concern: No  Gang Involvement:No   Subjective: The patient continues to feel as if she has very little left in the tank to give.  She says she is always given to other people and does not always feel like it is given back to her.  She cited situations in relationships in the past where she gave in those especially dating relationships did not give it back to her.  She says she feels like her brain is emotion her motivation to do anything is low.  She says she is not even sure how to best do things for herself.  She did speak to her manager in a staff meeting today letting him know how she is feeling any offer to help in any way that he can.  Most of her  PTO is taken out for things she is already committed to but he would allow her time off which I encouraged her to consider taking.  Talked about small ways where she can let her parents or friend just be there for her during the day so if she wanted to sleep or play video games all day they could arrange for food to be brought to her or delivered to her if she wanted to be alone.  We talked about how she could trust enough to put herself in a position for short-term rest but also how she looks at longer term rest.  She does say that she is not unsafe and does contract for safety but wants to continue to look ways to step away from the stresses of work etc.  She is aware that she can call if she needs anything in between appointments and that we can schedule more frequently than 2 weeks if she feels the need to do that.  She says right now 2 weeks are okay. The patient does contract for safety having no thoughts of hurting herself or anyone else. Target date: October 15, 2022.  I reviewed the treatment goals with the patient and she agreed to continue to with them as stated with an extended target  date of November 07, 2023. 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  The patient prefers to meet  every other week via video session.  Lorrene CHRISTELLA Hasten, [facility]                                                                                                                   Lorrene CHRISTELLA Hasten, [facility]    Lorrene CHRISTELLA Hasten, [facility]               Lorrene CHRISTELLA Hasten, [facility]               Lorrene CHRISTELLA Hasten, [facility]               Lorrene CHRISTELLA Hasten, [facility]       Lorrene CHRISTELLA Hasten, [facility]               Lorrene CHRISTELLA Hasten, [facility]               Lorrene CHRISTELLA Hasten,  [facility]               Lorrene CHRISTELLA Hasten, [facility]               Lorrene CHRISTELLA Hasten, [facility]               Lorrene CHRISTELLA Hasten, [facility]

## 2023-10-23 ENCOUNTER — Ambulatory Visit: Admitting: Behavioral Health

## 2023-10-23 ENCOUNTER — Encounter: Payer: Self-pay | Admitting: Behavioral Health

## 2023-10-23 DIAGNOSIS — F319 Bipolar disorder, unspecified: Secondary | ICD-10-CM

## 2023-10-23 DIAGNOSIS — F3181 Bipolar II disorder: Secondary | ICD-10-CM

## 2023-10-23 NOTE — Progress Notes (Signed)
 [facility] Counselor/Therapist Progress Note  Patient ID: Anne Marks, MRN: [id number],    Date: 10/23/2023 Length of session: 10:00am until 10:59 AM, 59 minutes. Time Spent: This session was held via video teletherapy. The patient consented to the video teletherapy and was located in her home during this session. She is aware it is the responsibility of the patient to secure confidentiality on her end of the session. The provider was in his outpatient therapy office for the duration of this session.      Treatment Type: Individual Therapy  The patient reports significant stress.  Her father is getting radiation daily and she cannot help with that at least directly but her siblings are doing nothing to help get her father back and forth from treatments.  She is being verbally and emotionally supportive and helping out financially when she can.  She has cut back some of her work responsibilities but is still getting behind.  She feels that she can catch up and her manager is working with her.  She did take a day off on a Monday a couple of weeks from now just to clear her head.  Her biggest frustration comes in the form of past relationships.  She is working to remain friends with one of them but the choices that she sees and that making sees him making are frustrating for her.  She said that she told him previously things that he should do and he did not list that now he is cut himself in a very difficult position and she knows she cannot help.  That hinders her friendship and that disappoints her.  The family of the father of her child made some very offhanded offensive comments to social media recently.  She is handling it very professionally but she processed some of what she would like to say but recognizes it would not make a difference.  She also realizes how much she and her child's adoptive parents care about that child and knows that she is making being a positive  difference.  We talked about different ways the patient could express some of the things she would like to say but knows would not accomplish anything but could bring some relief to her in addition to her regular therapy sessions.  Reported Symptoms: anxiety, depression  Mental Status Exam: Appearance:  Well Groomed     Behavior: Appropriate  Motor: Normal  Speech/Language:  Clear and Coherent  Affect: Appropriate  Mood: normal  Thought process: normal  Thought content:   WNL  Sensory/Perceptual disturbances:   WNL  Orientation: oriented to person, place, time/date, situation, day of week, month of year, and year  Attention: Good  Concentration: Good  Memory: WNL  Fund of knowledge:  Good  Insight:   Good  Judgment:  Good  Impulse Control: Good   Risk Assessment: Danger to Self:  No Self-injurious Behavior: No Danger to Others: No Duty to Warn:no Physical Aggression / Violence:No  Access to Firearms a concern: No  Gang Involvement:No   Subjective:  Target date: October 15, 2022.  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  The patient prefers to meet every other week via video session.  Anne Marks, [facility]  Anne Marks, [facility]    Anne Marks, [facility]               Anne Marks, [facility]               Anne Marks, [facility]               Anne Marks, [facility]       Anne Marks, [facility]               Anne Marks, [facility]               Anne Marks, [facility]               Anne Marks, [facility]               Anne Marks, [facility]               Anne Marks, [facility]               Anne Marks, [facility]

## 2023-11-02 ENCOUNTER — Encounter: Payer: Self-pay | Admitting: Adult Health

## 2023-11-02 ENCOUNTER — Telehealth: Admitting: Adult Health

## 2023-11-02 DIAGNOSIS — F4310 Post-traumatic stress disorder, unspecified: Secondary | ICD-10-CM

## 2023-11-02 DIAGNOSIS — F3181 Bipolar II disorder: Secondary | ICD-10-CM | POA: Diagnosis not present

## 2023-11-02 DIAGNOSIS — F411 Generalized anxiety disorder: Secondary | ICD-10-CM | POA: Diagnosis not present

## 2023-11-02 DIAGNOSIS — F900 Attention-deficit hyperactivity disorder, predominantly inattentive type: Secondary | ICD-10-CM

## 2023-11-02 MED ORDER — AMPHETAMINE-DEXTROAMPHET ER 20 MG PO CP24: 20.0000 mg | ORAL_CAPSULE | ORAL | 0 refills | Status: AC

## 2023-11-02 MED ORDER — AMPHETAMINE-DEXTROAMPHET ER 20 MG PO CP24: 20.0000 mg | ORAL_CAPSULE | Freq: Every day | ORAL | 0 refills | Status: AC

## 2023-11-02 MED ORDER — ALPRAZOLAM 0.5 MG PO TABS: 0.5000 mg | ORAL_TABLET | Freq: Every day | ORAL | 2 refills | Status: AC | PRN

## 2023-11-02 NOTE — Progress Notes (Signed)
 [name] [id number] 04/25/1990 33 y.o.  Virtual Visit via Video Note  I connected with pt @ on 11/02/23 at  8:30 AM EDT by a video enabled telemedicine application and verified that I am speaking with the correct person using two identifiers.   I discussed the limitations of evaluation and management by telemedicine and the availability of in person appointments. The patient expressed understanding and agreed to proceed.  I discussed the assessment and treatment plan with the patient. The patient was provided an opportunity to ask questions and all were answered. The patient agreed with the plan and demonstrated an understanding of the instructions.   The patient was advised to call back or seek an in-person evaluation if the symptoms worsen or if the condition fails to improve as anticipated.  I provided 20 minutes of non-face-to-face time during this encounter.  The patient was located at home.  The provider was located at [facility].   Anne LOISE Sayers, NP   Subjective:   Patient ID:  Anne Marks is a 33 y.o. (DOB 1990/10/08) adult.  Chief Complaint: No chief complaint on file.   HPI Anne Marks presents for follow-up of ADD, BPD-2, GAD, and PTSD.  Describes mood today as ok. Pleasant. Reports tearfulness. Mood symptoms - reports situational depression. Reports decreased anxiety. Reports irritability - feeling overwhelmed with different things. Reports feeling stressed with work, state of the world and family obligations.  Reports stable interest. Reports lower motivation. Denies panic attacks. Denies worry and over thinking. Reports rumination. Reports disassociation - episodes. Reports cyclical thinking. Reports mood as variable. Stating I feel like I'm doing ok - not great - not bad - manageable place. Taking medications as prescribed.  Energy levels lower. Active, exercises twice a week.  Enjoys some usual interests and activities. Lives with a roommate.  Spending time with family and friends. Appetite adequate - overeating more lately. Reports weight gain. Reports sleep has gotten a little worse lately. Averages 5 hours. Focus and concentration stable. Completing tasks. Managing aspects of household. Working full time - therapist. Denies SI or HI.  Denies AH or VH. Denies self harm. Denies substance use.  Previous medication trials: Buspar, Zoloft  Review of Systems:  Review of Systems  Musculoskeletal:  Negative for gait problem.  Neurological:  Negative for tremors.  Psychiatric/Behavioral:         Please refer to HPI    Medications: I have reviewed the patient's current medications.  Current Outpatient Medications  Medication Sig Dispense Refill   ALPRAZolam  (XANAX ) 0.5 MG tablet Take 1 tablet (0.5 mg total) by mouth daily as needed for anxiety. 30 tablet 2   amphetamine -dextroamphetamine  (ADDERALL XR) 20 MG 24 hr capsule Take 1 capsule (20 mg total) by mouth every morning. 30 capsule 0   amphetamine -dextroamphetamine  (ADDERALL XR) 20 MG 24 hr capsule Take 1 capsule (20 mg total) by mouth daily. 30 capsule 0   amphetamine -dextroamphetamine  (ADDERALL XR) 20 MG 24 hr capsule Take 1 capsule (20 mg total) by mouth daily. 30 capsule 0   buPROPion  (WELLBUTRIN  XL) 150 MG 24 hr tablet Take 1 tablet (150 mg total) by mouth daily. 90 tablet 1   ferrous sulfate  325 (65 FE) MG EC tablet Take 1 tablet (325 mg total) by mouth 2 (two) times daily. 60 tablet 3   ibuprofen  (ADVIL ,MOTRIN ) 600 MG tablet Take 1 tablet (600 mg total) by mouth every 6 (six) hours. 30 tablet 0   lamoTRIgine  (LAMICTAL ) 150 MG tablet Take  1 tablet (150 mg total) by mouth daily. 90 tablet 1   prazosin  (MINIPRESS ) 2 MG capsule Take one tablet at bedtime. 90 capsule 1   Prenatal Vit-Fe Fumarate-FA (PRENATAL VITAMIN PO) Take by mouth.     traZODone  (DESYREL ) 50 MG tablet Take 1 tablet (50 mg total) by mouth at bedtime. 90 tablet 1   No current facility-administered  medications for this visit.    Medication Side Effects: None  Allergies: No Known Allergies  Past Medical History:  Diagnosis Date   Bipolar 1 disorder (HCC)    Former smoker    Mental disorder     No family history on file.  Social History   Socioeconomic History   Marital status: Single    Spouse name: Not on file   Number of children: Not on file   Years of education: Not on file   Highest education level: Not on file  Occupational History   Not on file  Tobacco Use   Smoking status: Former    Current packs/day: 0.00    Types: Cigarettes    Quit date: 10/09/2011    Years since quitting: 12.0   Smokeless tobacco: Never  Substance and Sexual Activity   Alcohol use: Yes    Comment: a weekedn a month ; not while pregnant   Drug use: No   Sexual activity: Yes    Comment: one week ago  Other Topics Concern   Not on file  Social History Narrative   Not on file   Social Drivers of Health   Financial Resource Strain: Low Risk  (02/11/2022)   Received from [facility]   Overall Financial Resource Strain (CARDIA)    Difficulty of Paying Living Expenses: Not very hard  Food Insecurity: Food Insecurity Present (02/11/2022)   [facility] from [facility]   Hunger Vital Sign    Within the past 12 months, you worried that your food would run out before you got the money to buy more.: Sometimes true    Within the past 12 months, the food you bought just didn't last and you didn't have money to get more.: Patient declined  Transportation Needs: No Transportation Needs (02/11/2022)   Received from [facility] - Transportation    Lack of Transportation (Medical): No    Lack of Transportation (Non-Medical): No  Physical Activity: Insufficiently Active (02/11/2022)   Received from [facility]   Exercise Vital Sign    On average, how many days per week do you engage in moderate to strenuous exercise (like a brisk walk)?: 1 day    On average, how many minutes do you  engage in exercise at this level?: 10 min  Stress: No Stress Concern Present (02/11/2022)   Received from [facility] - Occupational Stress Questionnaire    Feeling of Stress : Only a little  Social Connections: Socially Integrated (02/11/2022)   Received from [facility]   Social Network    How would you rate your social network (family, work, friends)?: Good participation with social networks  Intimate Partner Violence: Not At Risk (02/11/2022)   Received from [facility]   HITS    Over the last 12 months how often did your partner physically hurt you?: Never    Over the last 12 months how often did your partner insult you or talk down to you?: Never    Over the last 12 months how often did your partner threaten you with  physical harm?: Never    Over the last 12 months how often did your partner scream or curse at you?: Never    Past Medical History, Surgical history, Social history, and Family history were reviewed and updated as appropriate.   Please see review of systems for further details on the patient's review from today.   Objective:   Physical Exam:  There were no vitals taken for this visit.  Physical Exam Constitutional:      General: Anne Marks is not in acute distress. Musculoskeletal:        General: No deformity.  Neurological:     Mental Status: Anne Marks is alert and oriented to person, place, and time.     Coordination: Coordination normal.  Psychiatric:        Attention and Perception: Attention and perception normal. Anne Marks does not perceive auditory or visual hallucinations.        Mood and Affect: Mood normal. Mood is not anxious or depressed. Affect is not labile, blunt, angry or inappropriate.        Speech: Speech normal.        Behavior: Behavior normal.        Thought Content: Thought content normal. Thought content is not paranoid or delusional. Thought content does not include homicidal or suicidal ideation. Thought  content does not include homicidal or suicidal plan.        Cognition and Memory: Cognition and memory normal.        Judgment: Judgment normal.     Comments: Insight intact     Lab Review:     Component Value Date/Time   NA 140 07/18/2017 1010   K 4.3 07/18/2017 1010   CL 106 07/18/2017 1010   CO2 26 07/18/2017 1010   GLUCOSE 111 (H) 07/18/2017 1010   BUN 9 07/18/2017 1010   CREATININE 0.71 07/18/2017 1010   CALCIUM 9.4 07/18/2017 1010   PROT 7.6 07/18/2017 1010   ALBUMIN 3.5 07/18/2017 1010   AST 14 (L) 07/18/2017 1010   ALT 19 07/18/2017 1010   ALKPHOS 84 07/18/2017 1010   BILITOT 0.5 07/18/2017 1010   GFRNONAA >60 07/18/2017 1010   GFRAA >60 07/18/2017 1010       Component Value Date/Time   WBC 9.7 07/18/2017 1010   RBC 4.51 07/18/2017 1010   HGB 12.0 07/18/2017 1010   HCT 38.5 07/18/2017 1010   PLT 459 (H) 07/18/2017 1010   MCV 85.4 07/18/2017 1010   MCH 26.6 07/18/2017 1010   MCHC 31.2 07/18/2017 1010   RDW 14.3 07/18/2017 1010   LYMPHSABS 2.0 07/18/2017 1010   MONOABS 0.5 07/18/2017 1010   EOSABS 0.1 07/18/2017 1010   BASOSABS 0.0 07/18/2017 1010    No results found for: POCLITH, LITHIUM   No results found for: PHENYTOIN, PHENOBARB, VALPROATE, CBMZ   .res Assessment: Plan:    Plan:  PDMP reviewed  Adderall XR 20mg  every morning   Wellbutrin  XL 150mg  every morning Lamictal  150mg  daily  Xanax  0.5mg  daily  Prazosin  1mg  capsule - 1 to 2 at bedtime as needed Trazadone 50mg  - 1 to 2 at hs as needed  RTC 4 weeks  20 minutes spent dedicated to the care of this patient on the date of this encounter to include pre-visit review of records, ordering of medication, post visit documentation, and face-to-face time with the patient discussing ADD, BPD-2, GAD and PTSD. Discussed continuing current medication regimen - but will allow for flexibility with sleep aids with current sleeping difficulties.  Patient advised to contact office with any  questions, adverse effects, or acute worsening in signs and symptoms.   Discussed potential benefits, risk, and side effects of benzodiazepines to include potential risk of tolerance and dependence, as well as possible drowsiness.  Advised patient not to drive if experiencing drowsiness and to take lowest possible effective dose to minimize risk of dependence and tolerance.   Counseled patient regarding potential benefits, risks, and side effects of Lamictal  to include potential risk of Stevens-Johnson syndrome. Advised patient to stop taking Lamictal  and contact office immediately if rash develops and to seek urgent medical attention if rash is severe and/or spreading quickly.   Discussed potential benefits, risks, and side effects of stimulants with patient to include increased heart rate, palpitations, insomnia, increased anxiety, increased irritability, or decreased appetite.  Instructed patient to contact office if experiencing any significant tolerability issues.   There are no diagnoses linked to this encounter.   Please see After Visit Summary for patient specific instructions.  Future Appointments  Date Time Provider Department Center  11/02/2023  8:30 AM Nellene Courtois, Anne Mattocks, NP [facility] None  11/06/2023 10:00 AM Zulema Lorrene HERO, [facility] None  11/20/2023 10:00 AM Zulema Lorrene HERO, [facility] None  12/04/2023 10:00 AM Zulema Lorrene HERO, [facility] None  12/18/2023 10:00 AM Zulema Lorrene HERO, [facility] None  01/01/2024 10:00 AM Zulema Lorrene HERO, [facility] None    No orders of the defined types were placed in this encounter.     -------------------------------

## 2023-11-06 ENCOUNTER — Ambulatory Visit: Admitting: Behavioral Health

## 2023-11-06 ENCOUNTER — Encounter: Payer: Self-pay | Admitting: Behavioral Health

## 2023-11-06 DIAGNOSIS — F3181 Bipolar II disorder: Secondary | ICD-10-CM

## 2023-11-06 DIAGNOSIS — F319 Bipolar disorder, unspecified: Secondary | ICD-10-CM | POA: Diagnosis not present

## 2023-11-06 NOTE — Progress Notes (Signed)
 [facility] Counselor/Therapist Progress Note  Patient ID: Anne Marks, MRN: [id number],    Date: 11/06/2023 Length of session: 10:05 am until 10:59 AM, 54 minutes. Time Spent: This session was held via video teletherapy. The patient consented to the video teletherapy and was located in her home during this session. She is aware it is the responsibility of the patient to secure confidentiality on her end of the session. The provider was in his outpatient therapy office for the duration of this session.      Treatment Type: Individual Therapy  The patient reports significant stress.  A close friend of hers has been diagnosed with breast cancer.  She already had another 1 who had lymphoma who she is doing her best to support and encouraged both of them.  Flare father is doing very well with his treatment handling things in a very healthy way.  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  In part based on some of the things that are happening around [place] she said some of her patients have even treated her in a very racist way.  Some have been very angry with her because they perceive that she t sees things differently than they do politically or spiritually.  She feels that she has maintained professionalism and treated them with respect but does not understand why they are acting the way that they are.  She for the most part had to dismiss one of them and felt that was an appropriate boundary.  There were some changes in the relationship of the guy that she used today.  She says she knows now I can maintain her friendship but will not date again.  Has a place to start of the next session she says she notices a pattern of especially with men starting off supportive friendly way and whether or not it turns into dating she ends up being the one that puts all of the effort in and she has not appreciated for  that.  The start look to see what maladaptive pattern might be in saying she has no issue with that with women. The patient does contract for safety having no thoughts of hurting herself or anyone else.   Reported Symptoms: anxiety, depression  Mental Status Exam: Appearance:  Well Groomed     Behavior: Appropriate  Motor: Normal  Speech/Language:  Clear and Coherent  Affect: Appropriate  Mood: normal  Thought process: normal  Thought content:   WNL  Sensory/Perceptual disturbances:   WNL  Orientation: oriented to person, place, time/date, situation, day of week, month of year, and year  Attention: Good  Concentration: Good  Memory: WNL  Fund of knowledge:  Good  Insight:   Good  Judgment:  Good  Impulse Control: Good   Risk Assessment: Danger to Self:  No Self-injurious Behavior: No Danger to Others: No Duty to Warn:no Physical Aggression / Violence:No  Access to Firearms a concern: No  Gang Involvement:No   Subjective:  Target date: October 15, 2022.  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  The patient prefers to meet every other week via video session.  Lorrene CHRISTELLA Hasten, [facility]  Lorrene CHRISTELLA Hasten, [facility]    Lorrene CHRISTELLA Hasten, [facility]               Lorrene CHRISTELLA Hasten, [facility]               Lorrene CHRISTELLA Hasten, [facility]               Lorrene CHRISTELLA Hasten, [facility]       Lorrene CHRISTELLA Hasten, [facility]               Lorrene CHRISTELLA Hasten, [facility]               Lorrene CHRISTELLA Hasten,  [facility]               Lorrene CHRISTELLA Hasten, [facility]               Lorrene CHRISTELLA Hasten, [facility]               Lorrene CHRISTELLA Hasten, [facility]               Lorrene CHRISTELLA Hasten, [facility]               Lorrene CHRISTELLA Hasten, [facility]

## 2023-11-20 ENCOUNTER — Ambulatory Visit: Admitting: Behavioral Health

## 2023-11-20 ENCOUNTER — Encounter: Payer: Self-pay | Admitting: Behavioral Health

## 2023-11-20 DIAGNOSIS — F319 Bipolar disorder, unspecified: Secondary | ICD-10-CM

## 2023-11-20 DIAGNOSIS — F3181 Bipolar II disorder: Secondary | ICD-10-CM

## 2023-11-20 NOTE — Progress Notes (Signed)
 [facility] Counselor/Therapist Progress Note  Patient ID: JAILEN LUNG, MRN: [id number],    Date: 11/20/2023 Length of session: 10:05 am until 10:59 AM, 54 minutes. Time Spent: This session was held via video teletherapy. The patient consented to the video teletherapy and was located in her home during this session. She is aware it is the responsibility of the patient to secure confidentiality on her end of the session. The provider was in his outpatient therapy office for the duration of this session.      Treatment Type: Individual Therapy  The patient spends some of the past weekend with her father which she said she enjoyed doing but was also good because she had a good conversation with both her father and her mother.  She said she started a conversation with her father about the type of people she had dated and her frustration with where all of that is.  She is currently talking to some guys on her dating app and says she says very clear boundaries with them.  She said there is a picture of her as well as where she works what she has not yet 1 guy in particular kept insisting on getting a picture from her wanting to know where she lives and they had only talked a couple of times.  She said she was very straightforward with him and does not have much tolerance for that when she has given them all the information that they need at least initially in the conversation.  She said her father told her that she could be a little harsh on some guys and her mother said that typically she dates when her mother called strays and that she takes people and that she wants to fix.  She recognized difficult backgrounds with bad family situations and that is something she feels that she is making progress on that and is more mindful of.  We talked more about what it is that she is looking for and what can be different moving forward in dating relationships and as she goes to dating websites.  She  expresses frustration and not being able to find the kind of guide who share some of her values such as honesty and ambition.  She does not tolerate very well and they do not have a plan to do things because she has everything planned to help and does not think it is unfair for her partner to want to have a plan in place also.  We will continue this conversation the next session asking her to think about what it looks like early in the relationship versus it is reported she recognizes that the person she is dating does not match some of these qualifications and if she continues, why. The patient does contract for safety having no thoughts of hurting herself or anyone else.   Reported Symptoms: anxiety, depression  Mental Status Exam: Appearance:  Well Groomed     Behavior: Appropriate  Motor: Normal  Speech/Language:  Clear and Coherent  Affect: Appropriate  Mood: normal  Thought process: normal  Thought content:   WNL  Sensory/Perceptual disturbances:   WNL  Orientation: oriented to person, place, time/date, situation, day of week, month of year, and year  Attention: Good  Concentration: Good  Memory: WNL  Fund of knowledge:  Good  Insight:   Good  Judgment:  Good  Impulse Control: Good   Risk Assessment: Danger to Self:  No Self-injurious Behavior: No Danger to Others: No Duty  to Warn:no Physical Aggression / Violence:No  Access to Firearms a concern: No  Gang Involvement:No   Subjective:  Target date: October 15, 2022.  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  The patient prefers to meet every other week via video session.  Lorrene CHRISTELLA Hasten,  [facility]                                                                                                                   Lorrene CHRISTELLA Hasten, [facility]    Lorrene CHRISTELLA Hasten, [facility]               Lorrene CHRISTELLA Hasten, [facility]               Lorrene CHRISTELLA Hasten, [facility]               Lorrene CHRISTELLA Hasten, [facility]       Lorrene CHRISTELLA Hasten, [facility]               Lorrene CHRISTELLA Hasten, [facility]               Lorrene CHRISTELLA Hasten, [facility]               Lorrene CHRISTELLA Hasten, [facility]               Lorrene CHRISTELLA Hasten, [facility]               Lorrene CHRISTELLA Hasten, [facility]               Lorrene CHRISTELLA Hasten, [facility]               Lorrene CHRISTELLA Hasten, [facility]               Lorrene CHRISTELLA Hasten, [facility]

## 2023-12-04 ENCOUNTER — Encounter: Payer: Self-pay | Admitting: Behavioral Health

## 2023-12-04 ENCOUNTER — Ambulatory Visit (INDEPENDENT_AMBULATORY_CARE_PROVIDER_SITE_OTHER): Admitting: Behavioral Health

## 2023-12-04 DIAGNOSIS — F319 Bipolar disorder, unspecified: Secondary | ICD-10-CM | POA: Diagnosis not present

## 2023-12-04 DIAGNOSIS — F3181 Bipolar II disorder: Secondary | ICD-10-CM

## 2023-12-04 NOTE — Progress Notes (Signed)
 [facility] Counselor/Therapist Progress Note  Patient ID: Anne Marks, MRN: [id number],    Date: 12/04/2023 Length of session: 10:05 am until 10:59 AM, 54 minutes. Time Spent: This session was held via video teletherapy. The patient consented to the video teletherapy and was located in her home during this session. She is aware it is the responsibility of the patient to secure confidentiality on her end of the session. The provider was in his outpatient therapy office for the duration of this session.      Treatment Type: Individual Therapy  A friend of the patient's came into town to spend the weekend with her and she said they had a great time but she was tired because they stayed busy the entire weekend.  She recognize that she has not been doing enough that especially with her friends in the area.  She reports an increase in depression.  He did reach out to her psychiatrist office to look at medication adjustments.  She said that even with sleep medicine she is not sleeping well and knows that affects her mood also.  She says she has passive thoughts of not wanting to live but does contract for safety saying she has no thoughts of hurting herself or anyone else but would just like to feel better.  She said the coping skills she has been using do not seem to be working so we went back to the basics to see what made a difference for her.  She recognize that she has not been connecting with her friends in this area.  A friend of hers had a birthday recently and invited her to several different celebrations for her but she turned them down.  She said her friend and recognize if she was not going to place but she continues to isolate.  She says she just does not want to be outside of the apartment other than for work so we talked about inviting some of her friends over as a way to start.  She says it is embarrassing to admit that she is not in a good place but I reminded her that her  friends are not being judgmental and will respond to her because of their care and compassion for her much as she would for them.  We talked about getting back to some of the things including playing music singing etc.  This type of year is difficult because she cannot get outside as much due to the weather but encouraged her to find some ways to be active.  She also is going to speak to her manager at work who is very understanding and telling that she is struggling with depression right now and look at ways that their agency can help but also ways she can come back some on her responsibilities there.  She is scheduled in 2 weeks but I will call her if I have a cancellation for next week.  She does contract for safety sake she will not hurt herself or anyone else.  Reported Symptoms: anxiety, depression  Mental Status Exam: Appearance:  Well Groomed     Behavior: Appropriate  Motor: Normal  Speech/Language:  Clear and Coherent  Affect: Appropriate  Mood: normal  Thought process: normal  Thought content:   WNL  Sensory/Perceptual disturbances:   WNL  Orientation: oriented to person, place, time/date, situation, day of week, month of year, and year  Attention: Good  Concentration: Good  Memory: WNL  Fund of knowledge:  Good  Insight:   Good  Judgment:  Good  Impulse Control: Good   Risk Assessment: Danger to Self:  No Self-injurious Behavior: No Danger to Others: No Duty to Warn:no Physical Aggression / Violence:No  Access to Firearms a concern: No  Gang Involvement:No   Subjective:  Target date: October 15, 2022.  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  The patient prefers to meet every other week via video session.  Lorrene CHRISTELLA Hasten,  [facility]                                                                                                                   Lorrene CHRISTELLA Hasten, [facility]    Lorrene CHRISTELLA Hasten, [facility]               Lorrene CHRISTELLA Hasten, [facility]               Lorrene CHRISTELLA Hasten, [facility]               Lorrene CHRISTELLA Hasten, [facility]       Lorrene CHRISTELLA Hasten, [facility]               Lorrene CHRISTELLA Hasten, [facility]               Lorrene CHRISTELLA Hasten, [facility]               Lorrene CHRISTELLA Hasten, [facility]               Lorrene CHRISTELLA Hasten, [facility]               Lorrene CHRISTELLA Hasten, [facility]               Lorrene CHRISTELLA Hasten, [facility]               Lorrene CHRISTELLA Hasten, [facility]               Lorrene CHRISTELLA Hasten, [facility]               Lorrene CHRISTELLA Hasten, [facility]

## 2023-12-18 ENCOUNTER — Encounter: Payer: Self-pay | Admitting: Behavioral Health

## 2023-12-18 ENCOUNTER — Ambulatory Visit: Admitting: Behavioral Health

## 2023-12-18 DIAGNOSIS — F3181 Bipolar II disorder: Secondary | ICD-10-CM | POA: Diagnosis not present

## 2023-12-18 NOTE — Progress Notes (Signed)
 [facility] Counselor/Therapist Progress Note  Patient ID: Anne Marks, MRN: [id number],    Date: 12/18/2023 Length of session: 10:00 am until 10:59 AM, 59 minutes. Time Spent: This session was held via video teletherapy. The patient consented to the video teletherapy and was located in her home during this session. She is aware it is the responsibility of the patient to secure confidentiality on her end of the session. The provider was in his outpatient therapy office for the duration of this session.      Treatment Type: Individual Therapy The patient has seen some elevations in over the past couple of weeks.  She still has reached out to her psychiatry office and they are playing phone tag back-and-forth but she feels confident they will connect soon.  She is trying to be more social and feels that has helped some with mood.  She spends some time with friends.  She is going to 2 events this weekend.  She is trying to do some things to work on self-care including drinking more water and eating healthier.  She has not been able to cut back on her work schedule as much as she would like to but I encouraged her to take a closer look at that so as not overwhelm herself.  She says that she still struggles going out in public especially by herself because she feels at times that people are looking at her or judging her primarily because of her weight.  Better when she is with friends because they can divert her attention away from her own self thoughts.  She tries to challenge them and use distractions such as putting her ear buds in being focused specifically on what she wants to buy but feels judged if she wants to buy cookies even though she has a basket full of fresh vegetables and fruits.  He knows that it comes from things that have been said to her directly about her weight.  She says the on set of so many usages of current weight loss medication such as Mounjaro seem to have made  people braver in their comments.  She went to an urgent care because of a skin condition she gets every time a season changes.  She said she knows exactly what she needs in the at the nurse and that urgent care felt the need to comment on asking her if she had thought about talking to her doctor about Mounjaro.  We continue to look at how she sees herself holistically and set up just physically and to focus on the things that she is doing to make herself feel better overall.  She does contract for safety sake she will not hurt herself or anyone else.  Reported Symptoms: anxiety, depression  Mental Status Exam: Appearance:  Well Groomed     Behavior: Appropriate  Motor: Normal  Speech/Language:  Clear and Coherent  Affect: Appropriate  Mood: normal  Thought process: normal  Thought content:   WNL  Sensory/Perceptual disturbances:   WNL  Orientation: oriented to person, place, time/date, situation, day of week, month of year, and year  Attention: Good  Concentration: Good  Memory: WNL  Fund of knowledge:  Good  Insight:   Good  Judgment:  Good  Impulse Control: Good   Risk Assessment: Danger to Self:  No Self-injurious Behavior: No Danger to Others: No Duty to Warn:no Physical Aggression / Violence:No  Access to Firearms a concern: No  Gang Involvement:No   Subjective:  Target  date: October 15, 2022.  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  The patient prefers to meet every other week via video session.  Lorrene CHRISTELLA Hasten,  [facility]                                                                                                                   Lorrene CHRISTELLA Hasten, [facility]    Lorrene CHRISTELLA Hasten, [facility]               Lorrene CHRISTELLA Hasten, [facility]               Lorrene CHRISTELLA Hasten, [facility]               Lorrene CHRISTELLA Hasten, [facility]       Lorrene CHRISTELLA Hasten, [facility]               Lorrene CHRISTELLA Hasten, [facility]               Lorrene CHRISTELLA Hasten, [facility]               Lorrene CHRISTELLA Hasten, [facility]               Lorrene CHRISTELLA Hasten, [facility]               Lorrene CHRISTELLA Hasten, [facility]               Lorrene CHRISTELLA Hasten, [facility]               Lorrene CHRISTELLA Hasten, [facility]               Lorrene CHRISTELLA Hasten, [facility]               Lorrene CHRISTELLA Hasten, [facility]               Lorrene CHRISTELLA Hasten, [facility]

## 2023-12-27 ENCOUNTER — Ambulatory Visit: Admitting: Behavioral Health

## 2023-12-27 ENCOUNTER — Encounter: Payer: Self-pay | Admitting: Behavioral Health

## 2023-12-27 DIAGNOSIS — F3181 Bipolar II disorder: Secondary | ICD-10-CM | POA: Diagnosis not present

## 2023-12-27 NOTE — Progress Notes (Signed)
 [facility] Counselor/Therapist Progress Note  Patient ID: Anne Marks, MRN: [id number],    Date: 12/27/2023 Length of session: 9:00 am until 9:58 AM, 58 minutes. Time Spent: This session was held via video teletherapy. The patient consented to the video teletherapy and was located in her home during this session. She is aware it is the responsibility of the patient to secure confidentiality on her end of the session. The provider was in his outpatient therapy office for the duration of this session.      Treatment Type: Individual Therapy The patient did to think this weekend which she enjoyed said were also very exhausting.  The first was almost an all day event on Saturday.  The upset was there was a lot of fun to do and she met a new group of friends which she appreciated but was also very crowded and there were some people who were very rude.  She says she does not tolerate well and people are intentionally not respectful or have manners and she felt like she had to very directly address that with some people.  On Sunday she went to a wedding for some good friends of hers and all those beautiful wedding she said weddings always start her feelings.  It made her look at some of the relationship she has been and we talked about various dating situation she has been in what she has learned from them.  She recognizes that she has very high standards and at times that makes it difficult to meet people.  She said and trying dating apps most people seem to be interested in 1 singular thing when she directly wants to get to know someone.  She feels like she has always had to pursue and is growing weary of pursuing and wants to be pursued.  She is doing a better job of self-care and how she talked to her so she knows that she has a lot to offer to someone. She does contract for safety sake she will not hurt herself or anyone else.  Reported Symptoms: anxiety, depression  Mental Status  Exam: Appearance:  Well Groomed     Behavior: Appropriate  Motor: Normal  Speech/Language:  Clear and Coherent  Affect: Appropriate  Mood: normal  Thought process: normal  Thought content:   WNL  Sensory/Perceptual disturbances:   WNL  Orientation: oriented to person, place, time/date, situation, day of week, month of year, and year  Attention: Good  Concentration: Good  Memory: WNL  Fund of knowledge:  Good  Insight:   Good  Judgment:  Good  Impulse Control: Good   Risk Assessment: Danger to Self:  No Self-injurious Behavior: No Danger to Others: No Duty to Warn:no Physical Aggression / Violence:No  Access to Firearms a concern: No  Gang Involvement:No   Subjective:  Target date: October 15, 2022.  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  The patient prefers to meet every other week via video session.  Lorrene CHRISTELLA Hasten, [facility]  Lorrene CHRISTELLA Hasten, [facility]    Lorrene CHRISTELLA Hasten, [facility]               Lorrene CHRISTELLA Hasten, [facility]               Lorrene CHRISTELLA Hasten, [facility]               Lorrene CHRISTELLA Hasten, [facility]       Lorrene CHRISTELLA Hasten, [facility]               Lorrene CHRISTELLA Hasten, [facility]               Lorrene CHRISTELLA Hasten, [facility]               Lorrene CHRISTELLA Hasten, [facility]               Lorrene CHRISTELLA Hasten, [facility]               Lorrene CHRISTELLA Hasten, [facility]               Lorrene CHRISTELLA Hasten, [facility]               Lorrene CHRISTELLA Hasten,  [facility]               Lorrene CHRISTELLA Hasten, [facility]               Lorrene CHRISTELLA Hasten, [facility]               Lorrene CHRISTELLA Hasten, [facility]               Lorrene CHRISTELLA Hasten, [facility]

## 2024-01-01 ENCOUNTER — Ambulatory Visit: Admitting: Behavioral Health

## 2024-01-10 ENCOUNTER — Ambulatory Visit: Admitting: Behavioral Health

## 2024-01-10 ENCOUNTER — Encounter: Payer: Self-pay | Admitting: Behavioral Health

## 2024-01-10 DIAGNOSIS — F3181 Bipolar II disorder: Secondary | ICD-10-CM

## 2024-01-10 NOTE — Progress Notes (Signed)
 [facility] Counselor/Therapist Progress Note  Patient ID: Anne Marks, MRN: [id number],    Date: 01/10/2024 Length of session: 2:01 PM until 2:59 PM, 58 minutes. Time Spent: This session was held via video teletherapy. The patient consented to the video teletherapy and was located in her home during this session. She is aware it is the responsibility of the patient to secure confidentiality on her end of the session. The provider was in his outpatient therapy office for the duration of this session.      Treatment Type: Individual Therapy  The patient spends a lot of Thanksgiving can with her family because her father had to have shoulder surgery a day before Thanksgiving.  When he is doing fairly well and they did have a fairly good time together.  We processed a conversation that she had with a friend of hers.  The patient knowledges that she has always been honest with friends.  She had a conversation recently with 1 friend which led to some frustrations with her not excepting the fact that she was honest even though he had asked for her to take certain things.  The conversation she question whether she felt what she said was valid and I validated the fact that she was being honest with him in response to the information that he was looking for.  We talked about being honest in relationships even if it is difficult at times. She does contract for safety sake she will not hurt herself or anyone else.  Reported Symptoms: anxiety, depression  Mental Status Exam: Appearance:  Well Groomed     Behavior: Appropriate  Motor: Normal  Speech/Language:  Clear and Coherent  Affect: Appropriate  Mood: normal  Thought process: normal  Thought content:   WNL  Sensory/Perceptual disturbances:   WNL  Orientation: oriented to person, place, time/date, situation, day of week, month of year, and year  Attention: Good  Concentration: Good  Memory: WNL  Fund of knowledge:  Good   Insight:   Good  Judgment:  Good  Impulse Control: Good   Risk Assessment: Danger to Self:  No Self-injurious Behavior: No Danger to Others: No Duty to Warn:no Physical Aggression / Violence:No  Access to Firearms a concern: No  Gang Involvement:No   Subjective:  Target date: October 15, 2022.  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  The patient prefers to meet every other week via video session.  Lorrene CHRISTELLA Hasten, [facility]                                                                                                                   Lorrene CHRISTELLA Hasten, [facility]    Lorrene CHRISTELLA Hasten, [facility]               Lorrene CHRISTELLA Hasten, [facility]  Lorrene CHRISTELLA Hasten, [facility]               Lorrene CHRISTELLA Hasten, [facility]       Lorrene CHRISTELLA Hasten, [facility]               Lorrene CHRISTELLA Hasten, [facility]               Lorrene CHRISTELLA Hasten, [facility]               Lorrene CHRISTELLA Hasten, [facility]               Lorrene CHRISTELLA Hasten, [facility]               Lorrene CHRISTELLA Hasten, [facility]               Lorrene CHRISTELLA Hasten, [facility]               Lorrene CHRISTELLA Hasten, [facility]               Lorrene CHRISTELLA Hasten, [facility]               Lorrene CHRISTELLA Hasten, [facility]               Lorrene CHRISTELLA Hasten, [facility]               Lorrene CHRISTELLA Hasten, [facility]               Lorrene CHRISTELLA Hasten, [facility]

## 2024-01-15 ENCOUNTER — Encounter: Payer: Self-pay | Admitting: Behavioral Health

## 2024-01-15 ENCOUNTER — Ambulatory Visit: Admitting: Behavioral Health

## 2024-01-15 DIAGNOSIS — F3181 Bipolar II disorder: Secondary | ICD-10-CM

## 2024-01-15 NOTE — Progress Notes (Signed)
 [facility] Counselor/Therapist Progress Note  Patient ID: Anne Marks, MRN: [id number],    Date: 01/15/2024 Length of session: 10:01 AM until 10:58 AM, 57 minutes. Time Spent: This session was held via video teletherapy. The patient consented to the video teletherapy and was located in her home during this session. She is aware it is the responsibility of the patient to secure confidentiality on her end of the session. The provider was in his outpatient therapy office for the duration of this session.      Treatment Type: Individual Therapy The patient says the past week has been fairly calm.  For the most part work is going well.  Her manager is very supportive and she feels she can talk about anything with him.  She spent time with coworker socially and had a great time doing that.  She is spending more time with her friends being social.  He had a lengthy conversation yesterday with her former partner and so that was a positive conversation.  She was able to share more of her long-term dreams with him.  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  One of her frustrations with him is that he does not think much about the long-term future even sometimes his short-term future and she is resting with that being a possible sticking point if they do decide to date again.  Both acknowledge that they still feeling somewhat sedated again but she does not want to do that until she feels relatively certain that he can be more of a dream or.  She knowledges that there are compromises that can be made and that he does not have to have her dreams exactly but just wants him to not blame his past for where he is now and to be able to look more focused toward the future. She does contract for safety sake she will not hurt herself or anyone else.  Reported Symptoms: anxiety, depression  Mental Status Exam: Appearance:  Well  Groomed     Behavior: Appropriate  Motor: Normal  Speech/Language:  Clear and Coherent  Affect: Appropriate  Mood: normal  Thought process: normal  Thought content:   WNL  Sensory/Perceptual disturbances:   WNL  Orientation: oriented to person, place, time/date, situation, day of week, month of year, and year  Attention: Good  Concentration: Good  Memory: WNL  Fund of knowledge:  Good  Insight:   Good  Judgment:  Good  Impulse Control: Good   Risk Assessment: Danger to Self:  No Self-injurious Behavior: No Danger to Others: No Duty to Warn:no Physical Aggression / Violence:No  Access to Firearms a concern: No  Gang Involvement:No   Subjective:  Target date: October 15, 2022.  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  The patient prefers to meet every other week via video session.  Lorrene CHRISTELLA Hasten, [facility]  Lorrene CHRISTELLA Hasten, [facility]    Lorrene CHRISTELLA Hasten, [facility]               Lorrene CHRISTELLA Hasten, [facility]               Lorrene CHRISTELLA Hasten, [facility]               Lorrene CHRISTELLA Hasten, [facility]       Lorrene CHRISTELLA Hasten, [facility]               Lorrene CHRISTELLA Hasten, [facility]               Lorrene CHRISTELLA Hasten, [facility]               Lorrene CHRISTELLA Hasten, [facility]               Lorrene CHRISTELLA Hasten, [facility]               Lorrene CHRISTELLA Hasten, [facility]               Lorrene CHRISTELLA Hasten, [facility]               Lorrene CHRISTELLA Hasten, [facility]               Lorrene CHRISTELLA Hasten,  [facility]               Lorrene CHRISTELLA Hasten, [facility]               Lorrene CHRISTELLA Hasten, [facility]               Lorrene CHRISTELLA Hasten, [facility]               Lorrene CHRISTELLA Hasten, [facility]               Lorrene CHRISTELLA Hasten, [facility]

## 2024-01-29 ENCOUNTER — Encounter: Payer: Self-pay | Admitting: Behavioral Health

## 2024-01-29 ENCOUNTER — Ambulatory Visit (INDEPENDENT_AMBULATORY_CARE_PROVIDER_SITE_OTHER): Admitting: Behavioral Health

## 2024-01-29 DIAGNOSIS — F3181 Bipolar II disorder: Secondary | ICD-10-CM
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
## 2024-02-12 ENCOUNTER — Ambulatory Visit (INDEPENDENT_AMBULATORY_CARE_PROVIDER_SITE_OTHER): Admitting: Behavioral Health

## 2024-02-12 ENCOUNTER — Encounter: Payer: Self-pay | Admitting: Behavioral Health

## 2024-02-12 DIAGNOSIS — F3181 Bipolar II disorder: Secondary | ICD-10-CM

## 2024-02-12 NOTE — Progress Notes (Signed)
 " [facility] Counselor/Therapist Progress Note  Patient ID: Anne Marks, MRN: [id number],    Date: 02/12/2024 Length of session: 10:01 AM until 10:57 AM, 56 minutes. Time Spent: This session was held via video teletherapy. The patient consented to the video teletherapy and was located in her home during this session. She is aware it is the responsibility of the patient to secure confidentiality on her end of the session. The provider was in his outpatient therapy office for the duration of this session.      Treatment Type: Individual Therapy The patient spent a fair amount of time with her family on Christmas Eve and Christmas Day.  She enjoyed the time with her parents but said that she recognize that being around her siblings and they are spouses particularly are very stressful for her.  She is angry because her siblings do not do anything to help her parents when they are closer geographically to her parents than the patient is.  She said there were a couple of verbal exchanges because a sister and brother-in-law overstep their mounds when the patient was trying to help in family situations.  She said she has come to the realization that long-term she is going to have to be available for her parents.  She wants to stay in the state but does not like the area where her parents still live.  She has talked with her parents about someday building a house where they could all live together.  We talked about what leads the patient to think about that and the pros and cons of doing that long-term.  She knows that her father's Parkinson's will progress.  He broke his arm the day after Christmas changing an outdoor light bulb and she along with her mother had to help care for him at the hospital and after he got home.  Her siblings were not involved.  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 She does contract for safety sake she will not hurt herself or anyone else.  Reported Symptoms: anxiety, depression  Mental Status Exam: Appearance:  Well Groomed     Behavior: Appropriate  Motor: Normal  Speech/Language:  Clear and Coherent  Affect: Appropriate  Mood: normal  Thought process: normal  Thought content:   WNL  Sensory/Perceptual disturbances:   WNL  Orientation: oriented to person, place, time/date, situation, day of week, month of year, and year  Attention: Good  Concentration: Good  Memory: WNL  Fund of knowledge:  Good  Insight:   Good  Judgment:  Good  Impulse Control: Good   Risk Assessment: Danger to Self:  No Self-injurious Behavior: No Danger to Others: No Duty to Warn:no Physical Aggression / Violence:No  Access to Firearms a concern: No  Gang Involvement:No   Subjective:  Target date: October 15, 2022.  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  The patient prefers to meet every other week via video session.  Lorrene CHRISTELLA Hasten, [facility]  Lorrene CHRISTELLA Hasten, [facility]    Lorrene CHRISTELLA Hasten, [facility]               Lorrene CHRISTELLA Hasten, [facility]               Lorrene CHRISTELLA Hasten, [facility]               Lorrene CHRISTELLA Hasten, [facility]       Lorrene CHRISTELLA Hasten, [facility]               Lorrene CHRISTELLA Hasten, [facility]               Lorrene CHRISTELLA Hasten, [facility]               Lorrene CHRISTELLA Hasten, [facility]               Lorrene CHRISTELLA Hasten,  [facility]               Lorrene CHRISTELLA Hasten, [facility]               Lorrene CHRISTELLA Hasten, [facility]               Lorrene CHRISTELLA Hasten, [facility]               Lorrene CHRISTELLA Hasten, [facility]               Lorrene CHRISTELLA Hasten, [facility]               Lorrene CHRISTELLA Hasten, [facility]               Lorrene CHRISTELLA Hasten, [facility]               Lorrene CHRISTELLA Hasten, [facility]               Lorrene CHRISTELLA Hasten, [facility]               Lorrene CHRISTELLA Hasten, [facility]               Lorrene CHRISTELLA Hasten, [facility] "

## 2024-02-26 ENCOUNTER — Encounter: Payer: Self-pay | Admitting: Behavioral Health

## 2024-02-26 ENCOUNTER — Ambulatory Visit: Admitting: Behavioral Health

## 2024-02-26 DIAGNOSIS — F3181 Bipolar II disorder: Secondary | ICD-10-CM | POA: Diagnosis not present

## 2024-02-26 NOTE — Progress Notes (Signed)
 " [facility] Counselor/Therapist Progress Note  Patient ID: LETRICIA Marks, MRN: [id number],    Date: 02/26/2024 Length of session: 10:01 AM until 10:58 AM, 57 minutes. Time Spent: This session was held via video teletherapy. The patient consented to the video teletherapy and was located in Anne Marks home during this session. She is aware it is the responsibility of the patient to secure confidentiality on Anne Marks end of the session. The provider was in his outpatient therapy office for the duration of this session.      Treatment Type: Individual Therapy  Reported Symptoms: anxiety, depression  Mental Status Exam: Appearance:  Well Groomed     Behavior: Appropriate  Motor: Normal  Speech/Language:  Clear and Coherent  Affect: Appropriate  Mood: normal  Thought process: normal  Thought content:   WNL  Sensory/Perceptual disturbances:   WNL  Orientation: oriented to person, place, time/date, situation, day of week, month of year, and year  Attention: Good  Concentration: Good  Memory: WNL  Fund of knowledge:  Good  Insight:   Good  Judgment:  Good  Impulse Control: Good   Risk Assessment: Danger to Self:  No Self-injurious Behavior: No Danger to Others: No Duty to Warn:no Physical Aggression / Violence:No  Access to Firearms a concern: No  Gang Involvement:No   Subjective: The patient reported that for the most part she describes Anne Marks mood as apathetic.  She says is not genuine depression.  She said she knows and wants to be around people but she does not have the energy or motivation to do so.  She is not thinking of hurting herself but says for the most part she is going home after work doing a few chores eating dinner and is in bed about 10:00.  She still wakes up at 4 AM so she staying up a little bit later because it does not affect the time that she gets up.  She feels for the most part that Anne Marks meds are in a good place but does acknowledge that there has been an influx  of rumination of thoughts over the past 2 weeks and I encouraged Anne Marks to speak to Anne Marks psychiatric group to make sure that Anne Marks meds are in a good place.  She acknowledges that a lot of the rumination thoughts revolve around how she sees herself and how at times she is hard on herself.  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  She knows the people see Anne Marks a smart and funny and kind but at times she says she just wants someone and she would prefer to be in a romantic relationship to see Anne Marks for more than that and to want to pour into Anne Marks as opposed to Anne Marks pouring into other people.  I encouraged Anne Marks to be vulnerable with the people that she is closest to and acknowledges that at times she just needs to be coddled and held.  We acknowledged that acceptance means that she sees Anne Marks self as intelligent funny smart etc. but to recognize that not all of who she is and is very mindful of how she projects herself.  She says that a lot of times people see the Anne Marks physically and cannot necessarily look beyond that to see more in terms of dating relationships.  We talked about how she can use cognitive behavioral repeat to reframe that but also start to use those principles to  tell herself that there is someone who will see Anne Marks for who she is holistically.   Target date: October 15, 2022.  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  The patient prefers to meet every other week via video session.  Lorrene CHRISTELLA Hasten,  [facility]                                                                                                                   Lorrene CHRISTELLA Hasten, [facility]    Lorrene CHRISTELLA Hasten, [facility]               Lorrene CHRISTELLA Hasten, [facility]               Lorrene CHRISTELLA Hasten, [facility]               Lorrene CHRISTELLA Hasten, [facility]       Lorrene CHRISTELLA Hasten, [facility]               Lorrene CHRISTELLA Hasten, [facility]               Lorrene CHRISTELLA Hasten, [facility]               Lorrene CHRISTELLA Hasten, [facility]               Lorrene CHRISTELLA Hasten, [facility]               Lorrene CHRISTELLA Hasten, [facility]               Lorrene CHRISTELLA Hasten, [facility]               Lorrene CHRISTELLA Hasten, [facility]               Lorrene CHRISTELLA Hasten, [facility]               Lorrene CHRISTELLA Hasten, [facility]               Lorrene CHRISTELLA Hasten, [facility]               Lorrene CHRISTELLA Hasten, [facility]               Lorrene CHRISTELLA Hasten, [facility]               Lorrene CHRISTELLA Hasten, [facility]               Lorrene CHRISTELLA Hasten, [facility]               Lorrene CHRISTELLA Hasten, [facility]               Lorrene CHRISTELLA Hasten, [facility] "

## 2024-03-11 ENCOUNTER — Encounter: Payer: Self-pay | Admitting: Behavioral Health

## 2024-03-11 ENCOUNTER — Ambulatory Visit: Admitting: Behavioral Health

## 2024-03-11 DIAGNOSIS — F3181 Bipolar II disorder: Secondary | ICD-10-CM

## 2024-03-11 DIAGNOSIS — F319 Bipolar disorder, unspecified: Secondary | ICD-10-CM | POA: Diagnosis not present

## 2024-03-25 ENCOUNTER — Ambulatory Visit: Admitting: Behavioral Health

## 2024-04-08 ENCOUNTER — Ambulatory Visit: Admitting: Behavioral Health

## 2024-04-22 ENCOUNTER — Ambulatory Visit: Admitting: Behavioral Health

## 2024-05-06 ENCOUNTER — Ambulatory Visit: Admitting: Behavioral Health
